# Patient Record
Sex: Male | Born: 1980 | Race: White | Hispanic: No | Marital: Married | Smoking: Former smoker
Health system: Southern US, Community
[De-identification: ages and names within clinical notes are randomized; demographics above are authoritative.]

## PROBLEM LIST (undated history)

## (undated) DIAGNOSIS — Z87442 Personal history of urinary calculi: Secondary | ICD-10-CM

## (undated) DIAGNOSIS — F32A Depression, unspecified: Secondary | ICD-10-CM

## (undated) DIAGNOSIS — J449 Chronic obstructive pulmonary disease, unspecified: Secondary | ICD-10-CM

## (undated) DIAGNOSIS — I10 Essential (primary) hypertension: Secondary | ICD-10-CM

## (undated) DIAGNOSIS — E079 Disorder of thyroid, unspecified: Secondary | ICD-10-CM

## (undated) DIAGNOSIS — E119 Type 2 diabetes mellitus without complications: Secondary | ICD-10-CM

## (undated) DIAGNOSIS — M549 Dorsalgia, unspecified: Secondary | ICD-10-CM

## (undated) DIAGNOSIS — F419 Anxiety disorder, unspecified: Secondary | ICD-10-CM

## (undated) DIAGNOSIS — G8929 Other chronic pain: Secondary | ICD-10-CM

## (undated) DIAGNOSIS — Y249XXA Unspecified firearm discharge, undetermined intent, initial encounter: Secondary | ICD-10-CM

## (undated) DIAGNOSIS — W3400XA Accidental discharge from unspecified firearms or gun, initial encounter: Secondary | ICD-10-CM

## (undated) HISTORY — PX: TONSILLECTOMY: SUR1361

## (undated) HISTORY — PX: CHOLECYSTECTOMY: SHX55

---

## 2007-08-08 ENCOUNTER — Emergency Department (HOSPITAL_COMMUNITY): Admission: EM | Admit: 2007-08-08 | Discharge: 2007-08-09 | Payer: Self-pay | Admitting: Emergency Medicine

## 2007-08-08 IMAGING — CR DG CHEST 1V PORT
1 series · 1 of 1 positions shown · non-contrast
Comparison: none

CLINICAL DATA: Chest pain.
 PORTABLE CHEST - 1 VIEW:

[view not recorded]
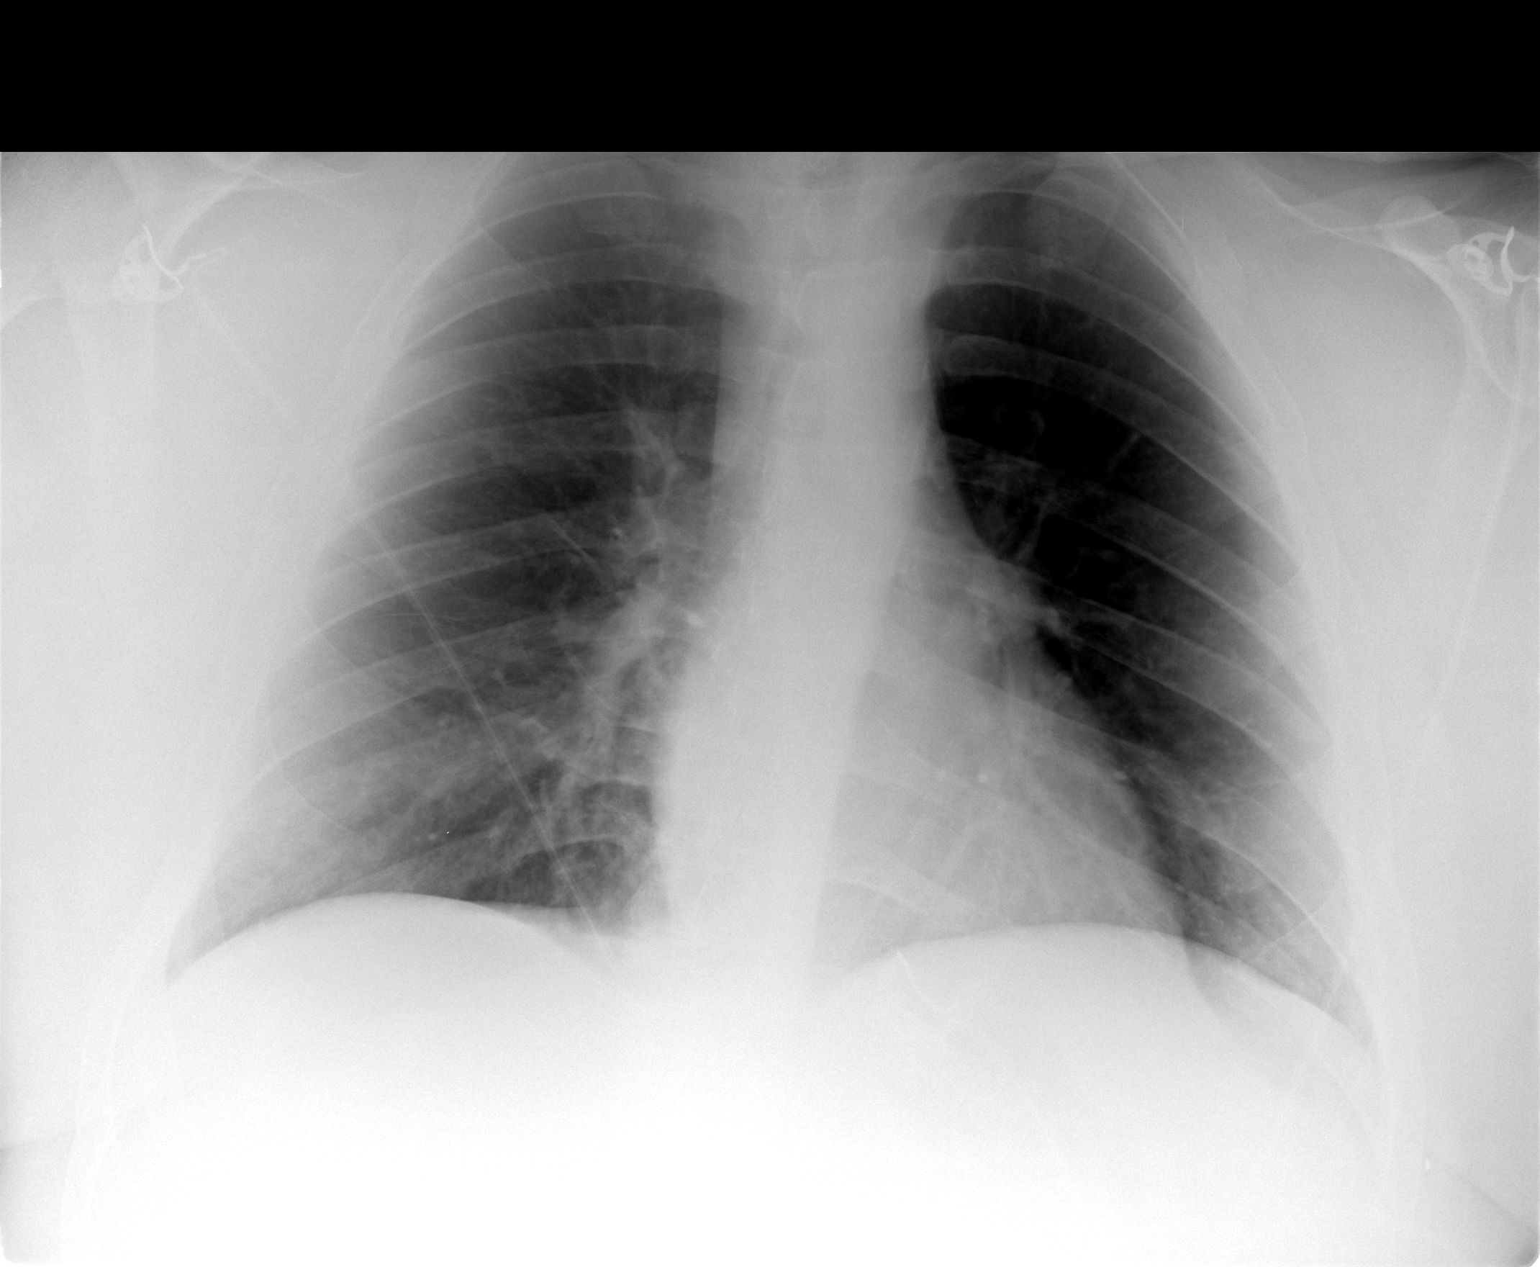

[1 of 1 positions shown; findings below may reference images not displayed]

FINDINGS: The heart size and mediastinal contours are within normal limits.   Both lungs are clear.
IMPRESSION: No acute findings.

## 2008-09-27 ENCOUNTER — Emergency Department (HOSPITAL_COMMUNITY): Admission: EM | Admit: 2008-09-27 | Discharge: 2008-09-27 | Payer: Self-pay | Admitting: Emergency Medicine

## 2009-04-10 ENCOUNTER — Inpatient Hospital Stay (HOSPITAL_COMMUNITY): Admission: EM | Admit: 2009-04-10 | Discharge: 2009-04-11 | Payer: Self-pay | Admitting: Emergency Medicine
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
## 2010-08-22 LAB — COMPREHENSIVE METABOLIC PANEL
AST: 31 U/L (ref 0–37)
Albumin: 3.9 g/dL (ref 3.5–5.2)
Alkaline Phosphatase: 43 U/L (ref 39–117)
Chloride: 106 mEq/L (ref 96–112)
Creatinine, Ser: 1.21 mg/dL (ref 0.4–1.5)
GFR calc Af Amer: >60 mL/min (ref >60)
Potassium: 3.7 mEq/L (ref 3.5–5.1)
Sodium: 137 mEq/L (ref 135–145)
Total Bilirubin: 0.6 mg/dL (ref 0.3–1.2)

## 2010-08-22 LAB — DIFFERENTIAL
Basophils Absolute: 0.1 10*3/uL (ref 0.0–0.1)
Eosinophils Relative: 3 % (ref 0–5)
Lymphocytes Relative: 22 % (ref 12–46)
Monocytes Absolute: 0.6 10*3/uL (ref 0.1–1.0)

## 2010-08-22 LAB — CBC
Platelets: 232 10*3/uL (ref 150–400)
WBC: 9.6 10*3/uL (ref 4.0–10.5)

## 2010-08-22 LAB — GLUCOSE, CAPILLARY: Glucose-Capillary: 174 mg/dL — ABNORMAL HIGH (ref 70–99)

## 2010-08-22 LAB — BLOOD GAS, ARTERIAL
Acid-base deficit: 2 mmol/L (ref 0.0–2.0)
O2 Saturation: 97.2 %
pO2, Arterial: 82.7 mmHg (ref 80.0–100.0)

## 2010-08-28 LAB — OVA AND PARASITE EXAMINATION: Ova and parasites: NONE SEEN

## 2010-08-28 LAB — CLOSTRIDIUM DIFFICILE EIA: C difficile Toxins A+B, EIA: NEGATIVE

## 2010-08-28 LAB — BASIC METABOLIC PANEL
Calcium: 9 mg/dL (ref 8.4–10.5)
GFR calc Af Amer: >60 mL/min (ref >60)
GFR calc non Af Amer: >60 mL/min (ref >60)
Sodium: 138 mEq/L (ref 135–145)

## 2011-02-11 LAB — BASIC METABOLIC PANEL
BUN: 14
CO2: 25
Calcium: 9.3
GFR calc non Af Amer: >60
Glucose, Bld: 106 — ABNORMAL HIGH

## 2011-02-11 LAB — CBC
HCT: 46.9
MCHC: 35
Platelets: 236
RDW: 12.9

## 2011-02-11 LAB — POCT CARDIAC MARKERS
CKMB, poc: 1.2
Myoglobin, poc: 47.1
Operator id: 198161
Troponin i, poc: <0.05

## 2011-02-11 LAB — DIFFERENTIAL
Basophils Absolute: 0
Basophils Relative: 0
Eosinophils Relative: 1
Lymphocytes Relative: 27
Neutro Abs: 6.6

## 2011-02-13 ENCOUNTER — Encounter: Payer: Self-pay | Admitting: Emergency Medicine

## 2011-02-13 ENCOUNTER — Emergency Department (HOSPITAL_COMMUNITY)
Admission: EM | Admit: 2011-02-13 | Discharge: 2011-02-13 | Disposition: A | Discharge status: Home / Self Care | Payer: BC Managed Care – PPO | Attending: Emergency Medicine | Admitting: Emergency Medicine

## 2011-02-13 ENCOUNTER — Emergency Department (HOSPITAL_COMMUNITY): Payer: BC Managed Care – PPO

## 2011-02-13 DIAGNOSIS — E079 Disorder of thyroid, unspecified: Secondary | ICD-10-CM | POA: Insufficient documentation

## 2011-02-13 DIAGNOSIS — Z87828 Personal history of other (healed) physical injury and trauma: Secondary | ICD-10-CM | POA: Insufficient documentation

## 2011-02-13 DIAGNOSIS — R1012 Left upper quadrant pain: Secondary | ICD-10-CM | POA: Insufficient documentation

## 2011-02-13 DIAGNOSIS — J45909 Unspecified asthma, uncomplicated: Secondary | ICD-10-CM | POA: Insufficient documentation

## 2011-02-13 DIAGNOSIS — R109 Unspecified abdominal pain: Secondary | ICD-10-CM

## 2011-02-13 DIAGNOSIS — Z87891 Personal history of nicotine dependence: Secondary | ICD-10-CM | POA: Insufficient documentation

## 2011-02-13 HISTORY — DX: Disorder of thyroid, unspecified: E07.9

## 2011-02-13 LAB — URINALYSIS, ROUTINE W REFLEX MICROSCOPIC
Bilirubin Urine: NEGATIVE
Hgb urine dipstick: NEGATIVE
Ketones, ur: NEGATIVE mg/dL
Nitrite: NEGATIVE
Specific Gravity, Urine: 1.01 (ref 1.005–1.030)
pH: 6.5 (ref 5.0–8.0)

## 2011-02-13 LAB — DIFFERENTIAL
Basophils Relative: 0 % (ref 0–1)
Eosinophils Absolute: 0.1 10*3/uL (ref 0.0–0.7)
Eosinophils Relative: 0 % (ref 0–5)
Lymphs Abs: 2.1 10*3/uL (ref 0.7–4.0)
Monocytes Absolute: 1.1 10*3/uL — ABNORMAL HIGH (ref 0.1–1.0)
Monocytes Relative: 8 % (ref 3–12)

## 2011-02-13 LAB — BASIC METABOLIC PANEL
BUN: 17 mg/dL (ref 6–23)
CO2: 30 mEq/L (ref 19–32)
Calcium: 8.6 mg/dL (ref 8.4–10.5)
Creatinine, Ser: 1.73 mg/dL — ABNORMAL HIGH (ref 0.50–1.35)
Creatinine, Ser: 1.75 mg/dL — ABNORMAL HIGH (ref 0.50–1.35)
GFR calc Af Amer: 57 mL/min — ABNORMAL LOW (ref >60)
GFR calc non Af Amer: 47 mL/min — ABNORMAL LOW (ref >60)
Glucose, Bld: 88 mg/dL (ref 70–99)
Glucose, Bld: 94 mg/dL (ref 70–99)
Potassium: 4.2 mEq/L (ref 3.5–5.1)

## 2011-02-13 LAB — CBC
HCT: 47.4 % (ref 39.0–52.0)
Hemoglobin: 16 g/dL (ref 13.0–17.0)
MCH: 32.1 pg (ref 26.0–34.0)
MCHC: 33.8 g/dL (ref 30.0–36.0)
MCV: 95 fL (ref 78.0–100.0)
RBC: 4.99 MIL/uL (ref 4.22–5.81)

## 2011-02-13 IMAGING — CT CT ABD-PELV W/ CM
2 of 4 series · 17 of 46 positions shown, 19 images · IV contrast (Omnipaque 300)
Comparison: None.

CLINICAL DATA: Epigastric/left upper quadrant abdominal pain

CT ABDOMEN AND PELVIS WITH CONTRAST
TECHNIQUE: Multidetector CT imaging of the abdomen and pelvis was
performed following the standard protocol during bolus
administration of intravenous contrast.
Contrast:  100 ml [DN] IV

[Series 2: abd_pel_with 5.0 b40s · axial · 0.98mm/px · z∈[-570,-124]mm · 14 of 99 slices shown, 16 images]
[im 5/99  soft-tissue]
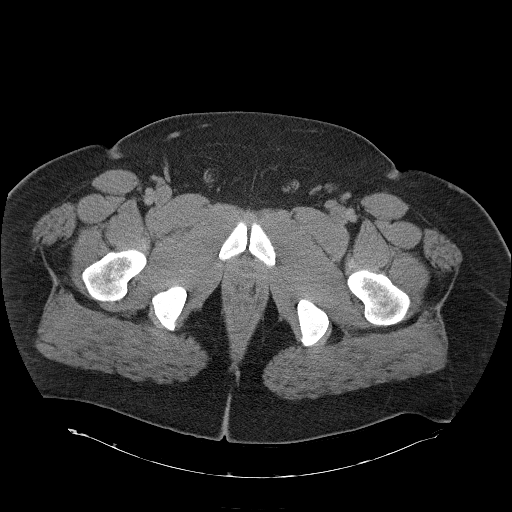
[im 5/99  bone]
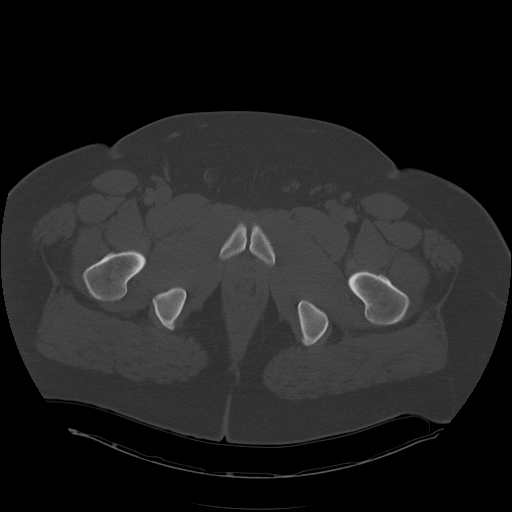
[im 13/99  soft-tissue]
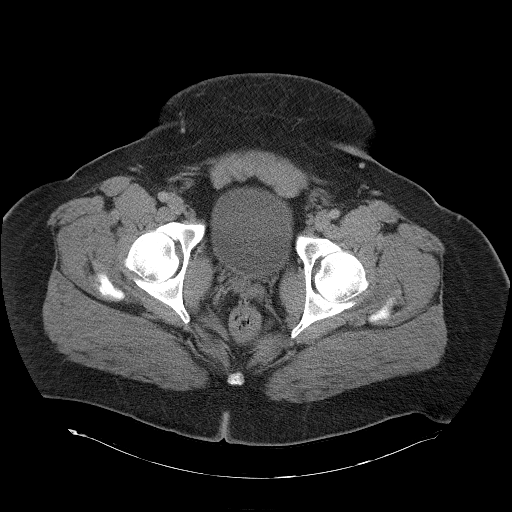
[im 21/99  soft-tissue]
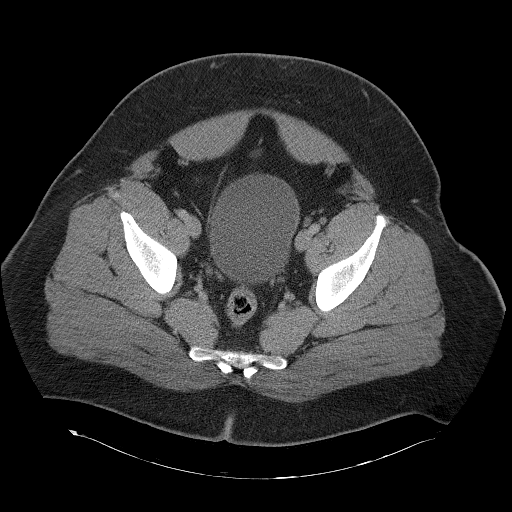
[im 25/99  soft-tissue]
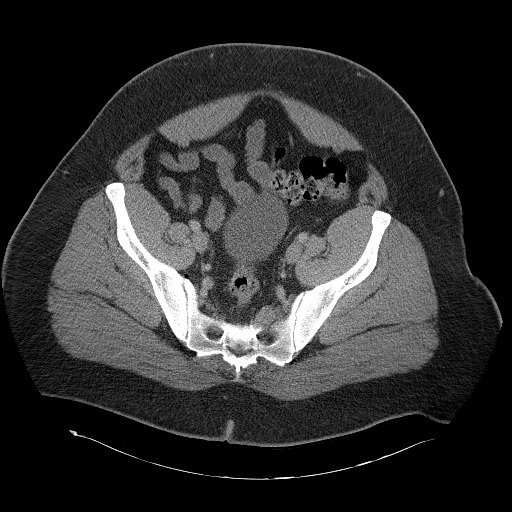
[im 33/99  soft-tissue]
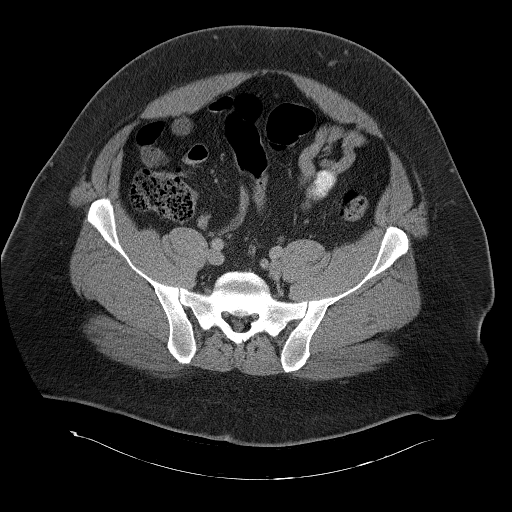
[im 41/99  soft-tissue]
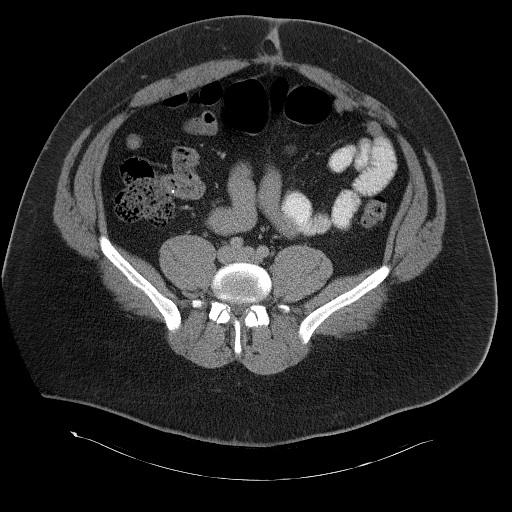
[im 45/99  soft-tissue]
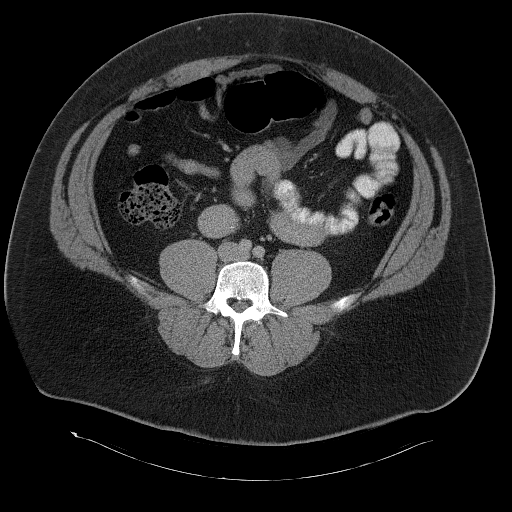
[im 54/99  soft-tissue]
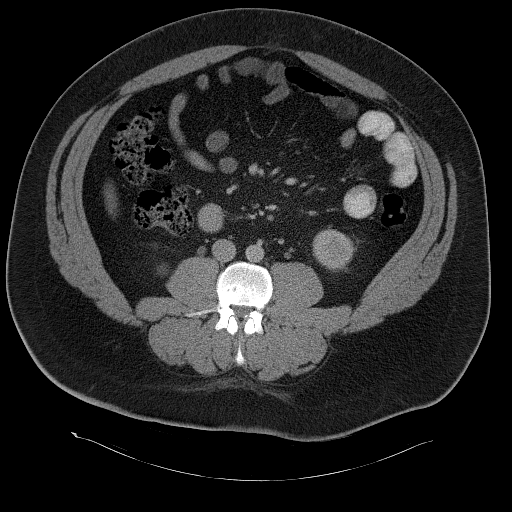
[im 58/99  soft-tissue]
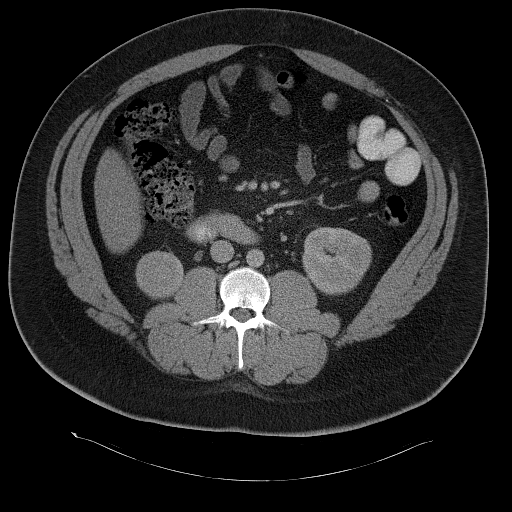
[im 58/99  bone]
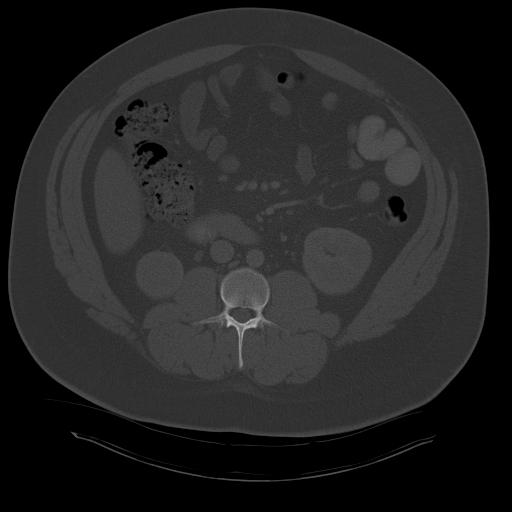
[im 66/99  soft-tissue]
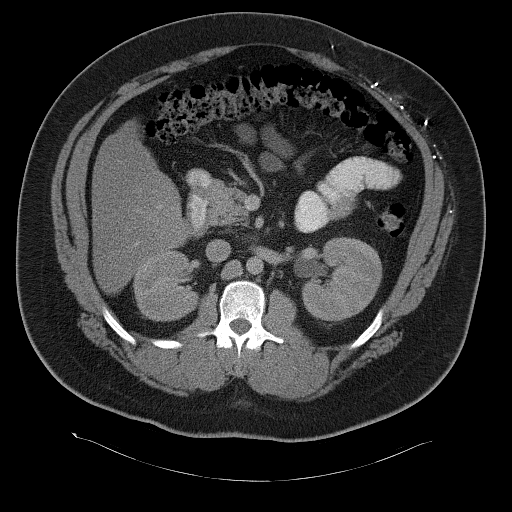
[im 74/99  soft-tissue]
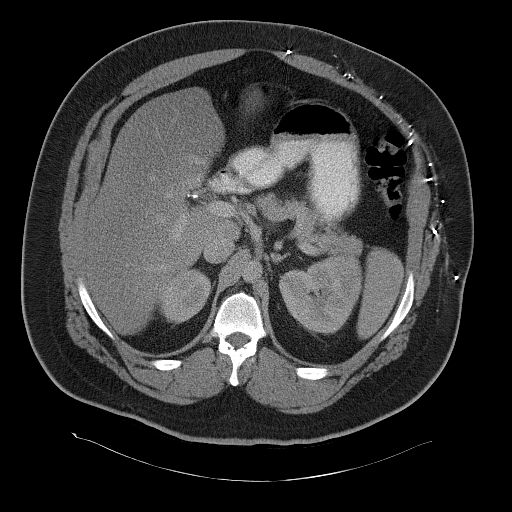
[im 78/99  soft-tissue]
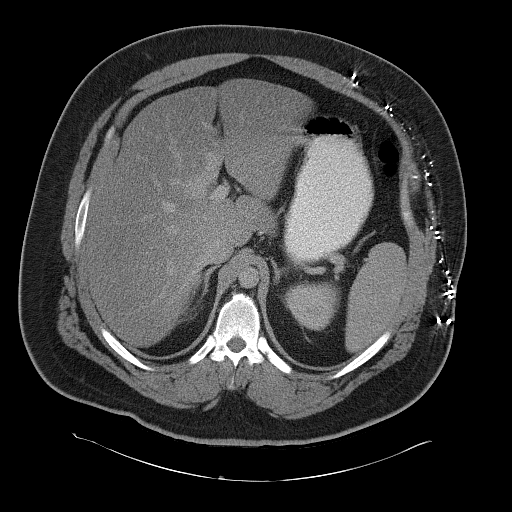
[im 86/99  soft-tissue]
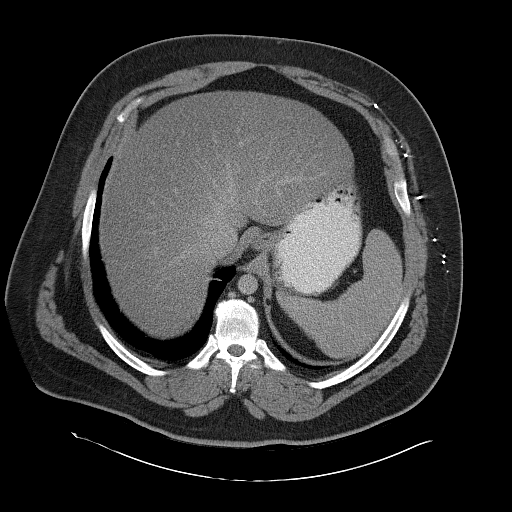
[im 94/99  soft-tissue]
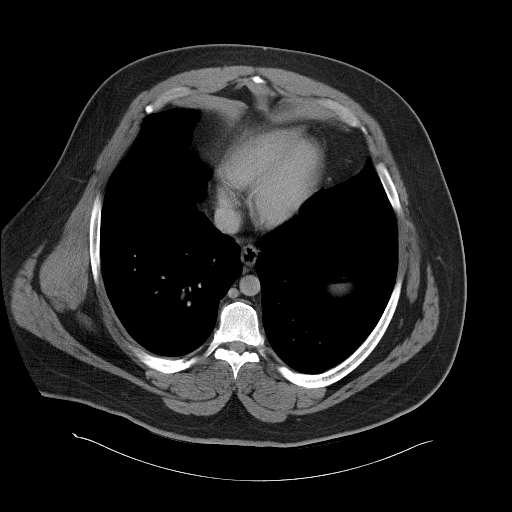

[Series 4: mpr cor post contrast (id) · coronal · 0.93mm/px · 3 of 130 slices shown]
[im 44/130  soft-tissue]
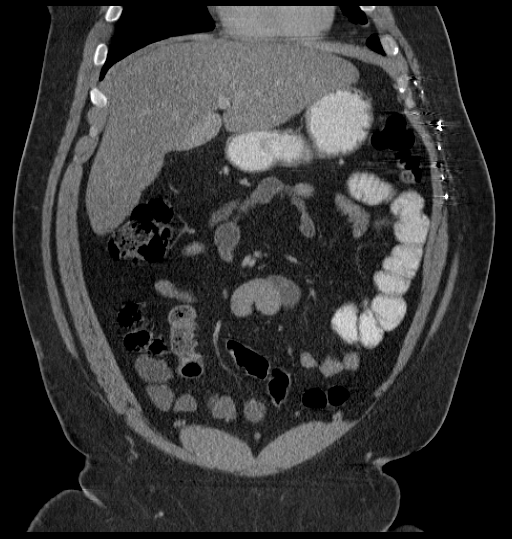
[im 58/130  soft-tissue]
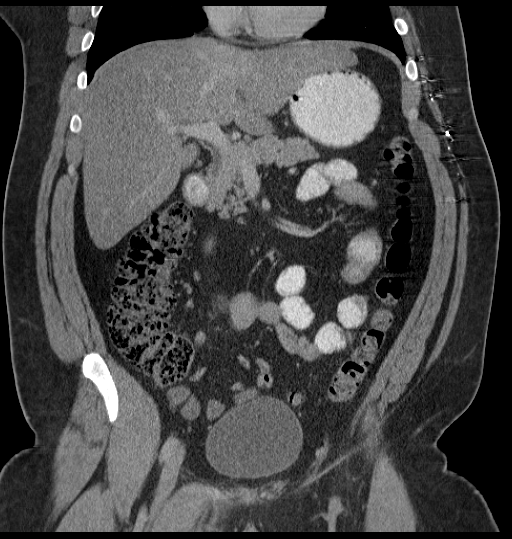
[im 72/130  soft-tissue]
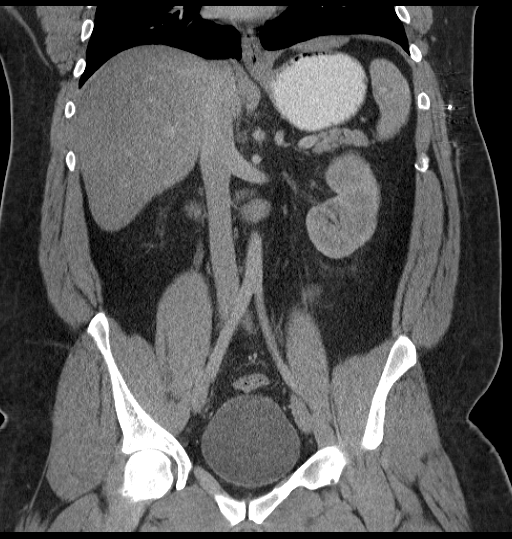

[17 of 46 positions shown; findings below may reference images not displayed]

FINDINGS: Lung bases are clear.

The liver is notable for hepatic steatosis.

Spleen, pancreas, and adrenal glands are within normal limits.

Status post cholecystectomy.  No intrahepatic or extrahepatic
ductal dilatation.

Kidneys are notable for a probable small left upper pole cyst.  No
renal calculi or hydronephrosis.

No evidence of bowel obstruction.  Normal appendix.  No colonic
wall thickening or inflammatory changes.

No evidence of abdominal aortic aneurysm.

No abdominopelvic ascites.

No suspicious abdominopelvic lymphadenopathy.

Prostate is unremarkable.

No ureteral or bladder calculi.  Bladder is within normal limits.

Tiny fat containing umbilical hernia (series 2/image 58).
Postsurgical changes in the left lateral abdominal wall.

Mild degenerative changes of the visualized thoracolumbar spine.
IMPRESSION: Hepatic steatosis.

No evidence of bowel obstruction.  Normal appendix.

Tiny umbilical hernia.

Postsurgical changes in the left lateral abdominal wall.

## 2011-02-13 MED ORDER — SODIUM CHLORIDE 0.9 % IV BOLUS (SEPSIS)
1000.0000 mL | Freq: Once | INTRAVENOUS | Status: AC
  Administered 2011-02-13: 1000 mL via INTRAVENOUS

## 2011-02-13 MED ORDER — MORPHINE SULFATE 4 MG/ML IJ SOLN
4.0000 mg | Freq: Once | INTRAMUSCULAR | Status: AC
  Administered 2011-02-13: 4 mg via INTRAVENOUS
  Filled 2011-02-13: qty 1

## 2011-02-13 MED ORDER — HYDROCODONE-ACETAMINOPHEN 5-500 MG PO TABS: 1.0000 | ORAL_TABLET | Freq: Four times a day (QID) | ORAL | Status: AC | PRN

## 2011-02-13 MED ORDER — IOHEXOL 300 MG/ML  SOLN: 100.0000 mL | Freq: Once | INTRAMUSCULAR | Status: DC | PRN

## 2011-02-13 MED ORDER — ONDANSETRON HCL 4 MG/2ML IJ SOLN
4.0000 mg | Freq: Once | INTRAMUSCULAR | Status: AC
  Administered 2011-02-13: 4 mg via INTRAVENOUS
  Filled 2011-02-13: qty 2

## 2011-02-13 NOTE — ED Notes (Signed)
Pt complaining of increased flank/abd pain. MD notified. New med order obtained and administered.

## 2011-02-13 NOTE — ED Notes (Signed)
Patient states he is still having severe pain. RN Jill Alexanders aware. Patient did not need anything else at this time.

## 2011-02-13 NOTE — ED Notes (Signed)
Patient is comfortable at this time states he does not need anything.
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
## 2011-02-13 NOTE — ED Notes (Signed)
Pt c/o left flank pain radiating into left abd. Worse food/drink/movement. Pt also c/o polyuria and "foamy urine". nad noted.

## 2011-02-13 NOTE — ED Notes (Signed)
Finished drinking his contrast.

## 2012-03-23 ENCOUNTER — Other Ambulatory Visit: Payer: Self-pay

## 2012-03-23 ENCOUNTER — Observation Stay (HOSPITAL_COMMUNITY)
Admission: EM | Admit: 2012-03-23 | Discharge: 2012-03-24 | Disposition: A | Discharge status: Home / Self Care | Payer: BC Managed Care – PPO | Attending: Internal Medicine | Admitting: Internal Medicine

## 2012-03-23 ENCOUNTER — Encounter (HOSPITAL_COMMUNITY): Payer: Self-pay | Admitting: Emergency Medicine

## 2012-03-23 ENCOUNTER — Emergency Department (HOSPITAL_COMMUNITY): Payer: BC Managed Care – PPO

## 2012-03-23 DIAGNOSIS — J45902 Unspecified asthma with status asthmaticus: Principal | ICD-10-CM | POA: Insufficient documentation

## 2012-03-23 DIAGNOSIS — R0602 Shortness of breath: Secondary | ICD-10-CM | POA: Insufficient documentation

## 2012-03-23 DIAGNOSIS — Z23 Encounter for immunization: Secondary | ICD-10-CM | POA: Insufficient documentation

## 2012-03-23 DIAGNOSIS — J45901 Unspecified asthma with (acute) exacerbation: Secondary | ICD-10-CM

## 2012-03-23 DIAGNOSIS — R0683 Snoring: Secondary | ICD-10-CM | POA: Diagnosis present

## 2012-03-23 DIAGNOSIS — M549 Dorsalgia, unspecified: Secondary | ICD-10-CM | POA: Diagnosis present

## 2012-03-23 DIAGNOSIS — R51 Headache: Secondary | ICD-10-CM | POA: Insufficient documentation

## 2012-03-23 DIAGNOSIS — G4733 Obstructive sleep apnea (adult) (pediatric): Secondary | ICD-10-CM | POA: Insufficient documentation

## 2012-03-23 HISTORY — DX: Dorsalgia, unspecified: M54.9

## 2012-03-23 HISTORY — DX: Other chronic pain: G89.29
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
## 2012-03-23 MED ORDER — BISACODYL 5 MG PO TBEC: 5.0000 mg | DELAYED_RELEASE_TABLET | Freq: Every day | ORAL | Status: DC | PRN

## 2012-03-23 MED ORDER — CYCLOBENZAPRINE HCL 10 MG PO TABS: 10.0000 mg | ORAL_TABLET | Freq: Three times a day (TID) | ORAL | Status: DC | PRN

## 2012-03-23 MED ORDER — ALBUTEROL SULFATE (5 MG/ML) 0.5% IN NEBU
5.0000 mg | INHALATION_SOLUTION | Freq: Once | RESPIRATORY_TRACT | Status: AC
  Administered 2012-03-23: 5 mg via RESPIRATORY_TRACT
  Filled 2012-03-23: qty 1

## 2012-03-23 MED ORDER — HYDROMORPHONE HCL PF 1 MG/ML IJ SOLN: 0.5000 mg | INTRAMUSCULAR | Status: DC | PRN

## 2012-03-23 MED ORDER — ALBUTEROL SULFATE (5 MG/ML) 0.5% IN NEBU
INHALATION_SOLUTION | RESPIRATORY_TRACT | Status: AC
  Filled 2012-03-23: qty 2

## 2012-03-23 MED ORDER — ALBUTEROL (5 MG/ML) CONTINUOUS INHALATION SOLN
10.0000 mg/h | INHALATION_SOLUTION | RESPIRATORY_TRACT | Status: DC
  Administered 2012-03-23: 10 mg/h via RESPIRATORY_TRACT

## 2012-03-23 MED ORDER — TRAZODONE HCL 50 MG PO TABS
25.0000 mg | ORAL_TABLET | Freq: Every evening | ORAL | Status: DC | PRN
  Administered 2012-03-24: 25 mg via ORAL
  Filled 2012-03-23: qty 2

## 2012-03-23 MED ORDER — GUAIFENESIN ER 600 MG PO TB12
1200.0000 mg | ORAL_TABLET | Freq: Two times a day (BID) | ORAL | Status: DC
  Administered 2012-03-24 (×2): 1200 mg via ORAL
  Filled 2012-03-23 (×2): qty 2

## 2012-03-23 MED ORDER — METHYLPREDNISOLONE SODIUM SUCC 125 MG IJ SOLR
125.0000 mg | Freq: Four times a day (QID) | INTRAMUSCULAR | Status: DC
  Administered 2012-03-24 (×3): 125 mg via INTRAVENOUS
  Filled 2012-03-23 (×3): qty 2

## 2012-03-23 MED ORDER — SODIUM CHLORIDE 0.9 % IV SOLN
INTRAVENOUS | Status: DC
  Administered 2012-03-24: via INTRAVENOUS

## 2012-03-23 MED ORDER — ACETAMINOPHEN 325 MG PO TABS: 650.0000 mg | ORAL_TABLET | ORAL | Status: DC | PRN

## 2012-03-23 MED ORDER — PNEUMOCOCCAL VAC POLYVALENT 25 MCG/0.5ML IJ INJ
0.5000 mL | INJECTION | INTRAMUSCULAR | Status: AC
  Administered 2012-03-24: 0.5 mL via INTRAMUSCULAR
  Filled 2012-03-23: qty 0.5

## 2012-03-23 MED ORDER — ENOXAPARIN SODIUM 40 MG/0.4ML ~~LOC~~ SOLN
40.0000 mg | SUBCUTANEOUS | Status: DC
  Administered 2012-03-24: 40 mg via SUBCUTANEOUS
  Filled 2012-03-23: qty 0.4

## 2012-03-23 MED ORDER — ALBUTEROL SULFATE (5 MG/ML) 0.5% IN NEBU: 2.5000 mg | INHALATION_SOLUTION | RESPIRATORY_TRACT | Status: DC | PRN

## 2012-03-23 MED ORDER — IPRATROPIUM BROMIDE 0.02 % IN SOLN
0.5000 mg | Freq: Once | RESPIRATORY_TRACT | Status: AC
  Administered 2012-03-23: 0.5 mg via RESPIRATORY_TRACT
  Filled 2012-03-23: qty 2.5

## 2012-03-23 MED ORDER — ALBUTEROL SULFATE (5 MG/ML) 0.5% IN NEBU
2.5000 mg | INHALATION_SOLUTION | RESPIRATORY_TRACT | Status: DC
  Administered 2012-03-24 (×4): 2.5 mg via RESPIRATORY_TRACT
  Filled 2012-03-23 (×4): qty 0.5

## 2012-03-23 MED ORDER — ALBUTEROL SULFATE HFA 108 (90 BASE) MCG/ACT IN AERS
2.0000 | INHALATION_SPRAY | Freq: Four times a day (QID) | RESPIRATORY_TRACT | Status: DC
  Administered 2012-03-23: 2 via RESPIRATORY_TRACT
  Filled 2012-03-23: qty 6.7

## 2012-03-23 MED ORDER — ONDANSETRON HCL 4 MG/2ML IJ SOLN: 4.0000 mg | INTRAMUSCULAR | Status: DC | PRN

## 2012-03-23 MED ORDER — INFLUENZA VIRUS VACC SPLIT PF IM SUSP
0.5000 mL | INTRAMUSCULAR | Status: AC
  Administered 2012-03-24: 0.5 mL via INTRAMUSCULAR
  Filled 2012-03-23: qty 0.5

## 2012-03-23 MED ORDER — POLYETHYLENE GLYCOL 3350 17 G PO PACK: 17.0000 g | PACK | Freq: Every day | ORAL | Status: DC | PRN

## 2012-03-23 MED ORDER — PREDNISONE 50 MG PO TABS
80.0000 mg | ORAL_TABLET | Freq: Once | ORAL | Status: AC
  Administered 2012-03-23: 80 mg via ORAL
  Filled 2012-03-23: qty 1

## 2012-03-23 MED ORDER — BUDESONIDE 0.25 MG/2ML IN SUSP
0.2500 mg | Freq: Two times a day (BID) | RESPIRATORY_TRACT | Status: DC
  Administered 2012-03-24: 0.25 mg via RESPIRATORY_TRACT
  Filled 2012-03-23 (×4): qty 2

## 2012-03-23 MED ORDER — FAMOTIDINE 20 MG PO TABS
20.0000 mg | ORAL_TABLET | Freq: Two times a day (BID) | ORAL | Status: DC
  Administered 2012-03-24 (×2): 20 mg via ORAL
  Filled 2012-03-23 (×2): qty 1

## 2012-03-23 MED ORDER — DOCUSATE SODIUM 100 MG PO CAPS
100.0000 mg | ORAL_CAPSULE | Freq: Two times a day (BID) | ORAL | Status: DC
  Administered 2012-03-24: 100 mg via ORAL
  Filled 2012-03-23: qty 1

## 2012-03-23 MED ORDER — FLEET ENEMA 7-19 GM/118ML RE ENEM: 1.0000 | ENEMA | Freq: Once | RECTAL | Status: AC | PRN

## 2012-03-23 MED ORDER — PREDNISONE 10 MG PO TABS: 40.0000 mg | ORAL_TABLET | Freq: Every day | ORAL | Status: DC

## 2012-03-23 MED ORDER — AZITHROMYCIN 500 MG IV SOLR
500.0000 mg | INTRAVENOUS | Status: DC
  Administered 2012-03-24: 500 mg via INTRAVENOUS
  Filled 2012-03-23 (×2): qty 500

## 2012-03-23 MED ORDER — OXYCODONE HCL 5 MG PO TABS: 10.0000 mg | ORAL_TABLET | ORAL | Status: DC | PRN

## 2012-03-23 NOTE — ED Notes (Signed)
Per RT, pt needs continuous neb. edp aware and vo given.
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
## 2012-03-23 NOTE — ED Notes (Signed)
Pt finished cont neb. Wheezing still present. Pt states he feels a little better.

## 2012-03-23 NOTE — ED Notes (Signed)
Pt ambulated with pulse ox. O2 sats dropped to 92% on room air with audible  Wheezing. MD aware will speak with patient

## 2012-03-23 NOTE — ED Notes (Signed)
Pt c/o sob x 2 days worse with exertion. Audible wheezing.

## 2012-03-24 ENCOUNTER — Encounter (HOSPITAL_COMMUNITY): Payer: Self-pay | Admitting: Internal Medicine

## 2012-03-24 DIAGNOSIS — J45909 Unspecified asthma, uncomplicated: Secondary | ICD-10-CM

## 2012-03-24 LAB — CBC
HCT: 46.5 % (ref 39.0–52.0)
Hemoglobin: 15.9 g/dL (ref 13.0–17.0)
MCH: 31.6 pg (ref 26.0–34.0)
MCHC: 34.2 g/dL (ref 30.0–36.0)
MCV: 91 fL (ref 78.0–100.0)
Platelets: 205 10*3/uL (ref 150–400)
RBC: 5.1 MIL/uL (ref 4.22–5.81)
RBC: 5.12 MIL/uL (ref 4.22–5.81)
WBC: 14.5 10*3/uL — ABNORMAL HIGH (ref 4.0–10.5)

## 2012-03-24 LAB — COMPREHENSIVE METABOLIC PANEL
ALT: 48 U/L (ref 0–53)
Alkaline Phosphatase: 46 U/L (ref 39–117)
BUN: 13 mg/dL (ref 6–23)
CO2: 23 mEq/L (ref 19–32)
Calcium: 9.6 mg/dL (ref 8.4–10.5)
GFR calc Af Amer: >90 mL/min (ref >90)
GFR calc non Af Amer: >90 mL/min (ref >90)
Glucose, Bld: 197 mg/dL — ABNORMAL HIGH (ref 70–99)
Sodium: 139 mEq/L (ref 135–145)
Total Protein: 7.4 g/dL (ref 6.0–8.3)

## 2012-03-24 LAB — TSH: TSH: 1.144 u[IU]/mL (ref 0.350–4.500)

## 2012-03-24 MED ORDER — GUAIFENESIN ER 600 MG PO TB12: 1200.0000 mg | ORAL_TABLET | Freq: Two times a day (BID) | ORAL | Status: DC

## 2012-03-24 MED ORDER — DEXTROSE 5 % IV SOLN
INTRAVENOUS | Status: AC
  Filled 2012-03-24: qty 500

## 2012-03-24 MED ORDER — SODIUM CHLORIDE 0.9 % IJ SOLN
INTRAMUSCULAR | Status: AC
  Administered 2012-03-24: 3 mL
  Filled 2012-03-24: qty 3

## 2012-03-24 MED ORDER — PREDNISONE 10 MG PO TABS: ORAL_TABLET | ORAL | Status: DC

## 2012-03-24 MED ORDER — ALBUTEROL SULFATE (5 MG/ML) 0.5% IN NEBU: 2.5000 mg | INHALATION_SOLUTION | RESPIRATORY_TRACT | Status: DC

## 2012-03-24 MED ORDER — ENOXAPARIN SODIUM 100 MG/ML ~~LOC~~ SOLN: 90.0000 mg | SUBCUTANEOUS | Status: DC

## 2012-03-24 MED ORDER — AZITHROMYCIN 500 MG PO TABS: 500.0000 mg | ORAL_TABLET | Freq: Every day | ORAL | Status: DC

## 2012-03-24 NOTE — Progress Notes (Signed)
UR Chart Review Completed  
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
## 2012-03-27 NOTE — Progress Notes (Signed)
Pt verbalizes understanding of d/c instructions, medications and f/u appts. IV d/c. No questions at this time. Sheryn Bison

## 2013-03-03 ENCOUNTER — Other Ambulatory Visit: Payer: Self-pay | Admitting: General Practice

## 2013-03-25 ENCOUNTER — Ambulatory Visit: Payer: Self-pay | Admitting: Bariatrics

## 2013-04-19 ENCOUNTER — Ambulatory Visit: Payer: Self-pay | Admitting: Bariatrics

## 2013-05-19 DIAGNOSIS — J45909 Unspecified asthma, uncomplicated: Secondary | ICD-10-CM | POA: Insufficient documentation

## 2013-05-19 DIAGNOSIS — E039 Hypothyroidism, unspecified: Secondary | ICD-10-CM | POA: Insufficient documentation

## 2013-05-19 DIAGNOSIS — J449 Chronic obstructive pulmonary disease, unspecified: Secondary | ICD-10-CM | POA: Insufficient documentation

## 2013-07-01 ENCOUNTER — Ambulatory Visit: Payer: Self-pay | Admitting: Bariatrics

## 2013-07-18 ENCOUNTER — Ambulatory Visit: Payer: Self-pay | Admitting: Bariatrics

## 2014-05-20 HISTORY — PX: OTHER SURGICAL HISTORY: SHX169

## 2016-07-03 ENCOUNTER — Other Ambulatory Visit: Payer: Self-pay | Admitting: Family Medicine

## 2016-07-03 DIAGNOSIS — N63 Unspecified lump in unspecified breast: Secondary | ICD-10-CM

## 2016-07-11 ENCOUNTER — Other Ambulatory Visit: Payer: BC Managed Care – PPO

## 2016-07-11 ENCOUNTER — Ambulatory Visit: Payer: BC Managed Care – PPO

## 2018-03-30 ENCOUNTER — Encounter: Payer: Self-pay | Admitting: Emergency Medicine

## 2018-03-30 ENCOUNTER — Other Ambulatory Visit: Payer: Self-pay

## 2018-03-30 DIAGNOSIS — F17210 Nicotine dependence, cigarettes, uncomplicated: Secondary | ICD-10-CM | POA: Diagnosis not present

## 2018-03-30 DIAGNOSIS — N23 Unspecified renal colic: Secondary | ICD-10-CM | POA: Diagnosis not present

## 2018-03-30 DIAGNOSIS — J449 Chronic obstructive pulmonary disease, unspecified: Secondary | ICD-10-CM | POA: Diagnosis not present

## 2018-03-30 DIAGNOSIS — N132 Hydronephrosis with renal and ureteral calculous obstruction: Secondary | ICD-10-CM | POA: Diagnosis not present

## 2018-03-30 DIAGNOSIS — R1012 Left upper quadrant pain: Secondary | ICD-10-CM | POA: Diagnosis present

## 2018-03-30 LAB — COMPREHENSIVE METABOLIC PANEL
ALBUMIN: 4.8 g/dL (ref 3.5–5.0)
ALK PHOS: 45 U/L (ref 38–126)
ALT: 27 U/L (ref 0–44)
AST: 30 U/L (ref 15–41)
Anion gap: 8 (ref 5–15)
BUN: 15 mg/dL (ref 6–20)
CALCIUM: 9.7 mg/dL (ref 8.9–10.3)
CO2: 26 mmol/L (ref 22–32)
Chloride: 105 mmol/L (ref 98–111)
Creatinine, Ser: 1.17 mg/dL (ref 0.61–1.24)
GFR calc Af Amer: >60 mL/min (ref >60)
GFR calc non Af Amer: >60 mL/min (ref >60)
Glucose, Bld: 134 mg/dL — ABNORMAL HIGH (ref 70–99)
Potassium: 3.9 mmol/L (ref 3.5–5.1)
SODIUM: 139 mmol/L (ref 135–145)
Total Bilirubin: 0.8 mg/dL (ref 0.3–1.2)
Total Protein: 8 g/dL (ref 6.5–8.1)

## 2018-03-30 LAB — URINALYSIS, COMPLETE (UACMP) WITH MICROSCOPIC
BACTERIA UA: NONE SEEN
BILIRUBIN URINE: NEGATIVE
Glucose, UA: NEGATIVE mg/dL
Ketones, ur: NEGATIVE mg/dL
Leukocytes, UA: NEGATIVE
Nitrite: NEGATIVE
Protein, ur: NEGATIVE mg/dL
RBC / HPF: >50 RBC/hpf — ABNORMAL HIGH (ref 0–5)
SPECIFIC GRAVITY, URINE: 1.016 (ref 1.005–1.030)
SQUAMOUS EPITHELIAL / LPF: NONE SEEN (ref 0–5)
pH: 5 (ref 5.0–8.0)

## 2018-03-30 LAB — CBC
HCT: 51.7 % (ref 39.0–52.0)
HEMOGLOBIN: 17.6 g/dL — AB (ref 13.0–17.0)
MCH: 31.2 pg (ref 26.0–34.0)
MCHC: 34 g/dL (ref 30.0–36.0)
MCV: 91.5 fL (ref 80.0–100.0)
Platelets: 267 10*3/uL (ref 150–400)
RBC: 5.65 MIL/uL (ref 4.22–5.81)
RDW: 12.2 % (ref 11.5–15.5)
WBC: 12.2 10*3/uL — ABNORMAL HIGH (ref 4.0–10.5)
nRBC: 0 % (ref 0.0–0.2)

## 2018-03-30 LAB — LIPASE, BLOOD: Lipase: 37 U/L (ref 11–51)

## 2018-03-30 NOTE — ED Triage Notes (Signed)
Pt in with left sided rib pain radiates to left abd started at 1600. Worse when he moves, denies any vomiting or diarrhea but does have nausea.

## 2018-03-31 ENCOUNTER — Emergency Department
Admission: EM | Admit: 2018-03-31 | Discharge: 2018-03-31 | Disposition: A | Discharge status: Home / Self Care | Payer: BC Managed Care – PPO | Attending: Emergency Medicine | Admitting: Emergency Medicine

## 2018-03-31 ENCOUNTER — Emergency Department: Payer: BC Managed Care – PPO

## 2018-03-31 ENCOUNTER — Encounter: Payer: Self-pay | Admitting: Emergency Medicine

## 2018-03-31 DIAGNOSIS — N23 Unspecified renal colic: Secondary | ICD-10-CM

## 2018-03-31 DIAGNOSIS — N201 Calculus of ureter: Secondary | ICD-10-CM

## 2018-03-31 HISTORY — DX: Chronic obstructive pulmonary disease, unspecified: J44.9

## 2018-03-31 HISTORY — DX: Unspecified firearm discharge, undetermined intent, initial encounter: Y24.9XXA

## 2018-03-31 HISTORY — DX: Personal history of urinary calculi: Z87.442

## 2018-03-31 HISTORY — DX: Accidental discharge from unspecified firearms or gun, initial encounter: W34.00XA
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
## 2018-03-31 MED ORDER — ONDANSETRON 4 MG PO TBDP: ORAL_TABLET | ORAL | 0 refills | Status: DC

## 2018-03-31 MED ORDER — TAMSULOSIN HCL 0.4 MG PO CAPS: ORAL_CAPSULE | ORAL | 0 refills | Status: DC

## 2018-03-31 MED ORDER — KETOROLAC TROMETHAMINE 30 MG/ML IJ SOLN
15.0000 mg | Freq: Once | INTRAMUSCULAR | Status: AC
  Administered 2018-03-31: 15 mg via INTRAVENOUS

## 2018-03-31 MED ORDER — OXYCODONE-ACETAMINOPHEN 5-325 MG PO TABS: 1.0000 | ORAL_TABLET | Freq: Four times a day (QID) | ORAL | 0 refills | Status: DC | PRN

## 2018-03-31 MED ORDER — ONDANSETRON HCL 4 MG/2ML IJ SOLN
INTRAMUSCULAR | Status: AC
  Filled 2018-03-31: qty 2

## 2018-03-31 MED ORDER — DOCUSATE SODIUM 100 MG PO CAPS: ORAL_CAPSULE | ORAL | 0 refills | Status: DC

## 2018-03-31 MED ORDER — OXYCODONE-ACETAMINOPHEN 5-325 MG PO TABS
2.0000 | ORAL_TABLET | Freq: Once | ORAL | Status: AC
  Administered 2018-03-31: 2 via ORAL
  Filled 2018-03-31: qty 2

## 2018-03-31 MED ORDER — KETOROLAC TROMETHAMINE 30 MG/ML IJ SOLN
INTRAMUSCULAR | Status: AC
  Filled 2018-03-31: qty 1

## 2018-03-31 MED ORDER — MORPHINE SULFATE (PF) 4 MG/ML IV SOLN
8.0000 mg | Freq: Once | INTRAVENOUS | Status: AC
  Administered 2018-03-31: 8 mg via INTRAVENOUS

## 2018-03-31 MED ORDER — ONDANSETRON HCL 4 MG/2ML IJ SOLN
4.0000 mg | INTRAMUSCULAR | Status: AC
  Administered 2018-03-31: 4 mg via INTRAVENOUS

## 2018-03-31 MED ORDER — MORPHINE SULFATE (PF) 4 MG/ML IV SOLN
INTRAVENOUS | Status: AC
  Filled 2018-03-31: qty 1

## 2018-03-31 NOTE — ED Notes (Signed)
Dr. York Ceriseforbach at bedside. Pt has left sided flank pain that started today.

## 2018-03-31 NOTE — Discharge Instructions (Signed)
You have been seen in the Emergency Department (ED) today for pain that we believe based on your workup, is caused by kidney stones.  As we have discussed, please drink plenty of fluids.  Please make a follow up appointment with the physician(s) listed elsewhere in this documentation.  You may take pain medication as needed but ONLY as prescribed.  Please also take your prescribed Flomax daily.  Please do not take ibuprofen, naproxen, aspirin, or any other NSAID until you follow up with urology; taking any of that kind of medication will prevent you from having lithotripsy on Thursday.  Call the urologist at the number provided and let them know that you are in the emergency department and that you have a 3 mm proximal obstructive ureteral stone on the left side, and that you would like to talk to Dr. Apolinar JunesBrandon about may be having lithotripsy on Thursday.  They will help you set up an appointment and give you additional instructions.  Do not drink alcohol, drive or participate in any other potentially dangerous activities while taking opiate pain medication as it may make you sleepy. Do not take this medication with any other sedating medications, either prescription or over-the-counter. If you were prescribed Percocet or Vicodin, do not take these with acetaminophen (Tylenol) as it is already contained within these medications.   Take Percocet as needed for severe pain.  This medication is an opiate (or narcotic) pain medication and can be habit forming.  Use it as little as possible to achieve adequate pain control.  Do not use or use it with extreme caution if you have a history of opiate abuse or dependence.  If you are on a pain contract with your primary care doctor or a pain specialist, be sure to let them know you were prescribed this medication today from the [facility].  This medication is intended for your use only - do not give any to anyone else and keep it in a secure  place where nobody else, especially children, have access to it.  It will also cause or worsen constipation, so you may want to consider taking an over-the-counter stool softener while you are taking this medication.  Return to the Emergency Department (ED) or call your doctor if you have any worsening pain, fever, painful urination, are unable to urinate, or develop other symptoms that concern you.
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
## 2018-04-02 ENCOUNTER — Ambulatory Visit (INDEPENDENT_AMBULATORY_CARE_PROVIDER_SITE_OTHER): Payer: BC Managed Care – PPO | Admitting: Urology

## 2018-04-02 ENCOUNTER — Other Ambulatory Visit: Payer: Self-pay

## 2018-04-02 ENCOUNTER — Encounter: Payer: Self-pay | Admitting: Urology

## 2018-04-02 VITALS — BP 139/90 | HR 105 | Wt 328.0 lb

## 2018-04-02 DIAGNOSIS — N201 Calculus of ureter: Secondary | ICD-10-CM
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
## 2018-05-05 ENCOUNTER — Ambulatory Visit: Payer: BC Managed Care – PPO | Admitting: Urology

## 2018-06-03 ENCOUNTER — Ambulatory Visit (INDEPENDENT_AMBULATORY_CARE_PROVIDER_SITE_OTHER): Payer: BC Managed Care – PPO | Admitting: Urology

## 2018-06-03 ENCOUNTER — Encounter: Payer: Self-pay | Admitting: Urology

## 2018-06-03 ENCOUNTER — Other Ambulatory Visit: Payer: Self-pay

## 2018-06-03 VITALS — BP 141/84 | HR 92 | Ht 73.0 in | Wt 336.0 lb

## 2018-06-03 DIAGNOSIS — K219 Gastro-esophageal reflux disease without esophagitis: Secondary | ICD-10-CM | POA: Insufficient documentation

## 2018-06-03 DIAGNOSIS — N200 Calculus of kidney: Secondary | ICD-10-CM

## 2018-06-03 NOTE — Progress Notes (Signed)
   06/03/2018 8:33 AM   Geanie Kenning 06/14/1980 [id number]  Reason for visit: Follow up nephrolithiasis  HPI: I saw Mr. Willie Strong back in urology clinic today in follow-up for nephrolithiasis.  I previously saw him in November 2019 when he was having acute left flank pain from a 3 mm proximal ureteral stone.  He reports his pain has since resolved and he passed the stone, however he since lost it was unable to bring it in for analysis today.  He has 5-6 previously passed stones spontaneously, and has never required intervention.  He has a strong family history of stone disease.  He had an ultrasound done in follow-up at an outside clinic to confirm resolution of his hydronephrosis, however these results are unavailable to me at this time.  He reports that the hydronephrosis had resolved.  There are no aggravating or alleviating factors.  Severity is mild.   ROS: Please see flowsheet from today's date for complete review of systems.  Physical Exam: BP (!) 141/84   Pulse 92   Ht 6\' 1"  (1.854 m)   Wt (!) 336 lb (152.4 kg)   BMI 44.33 kg/m    Constitutional:  Alert and oriented, No acute distress.  Obese Respiratory: Normal respiratory effort, no increased work of breathing. GI: Abdomen is soft, nontender, nondistended, no abdominal masses GU: No CVA tenderness Skin: No rashes, bruises or suspicious lesions. Neurologic: Grossly intact, no focal deficits, moving all 4 extremities. Psychiatric: Normal mood and affect  Laboratory Data: Calcium 9.7  Pertinent Imaging: We will obtain outside ultrasound  Assessment & Plan:   In summary, the patient is a 38 year old male with obesity and recurrent stone disease.  I offered him metabolic evaluation today with 24-hour urine collection, and he deferred at this time secondary to his hectic work schedule.  I did recommend obtaining a PTH and calcium today to rule out hyperparathyroidism in the setting of his borderline elevated calcium  previously.  We discussed general stone prevention strategies including adequate hydration with goal of producing 2.5 L of urine daily, increasing citric acid intake, increasing calcium intake during high oxalate meals, minimizing animal protein, and decreasing salt intake. Information about dietary recommendations given today.   Serum PTH and calcium, call with results Follow-up outside ultrasound to confirm resolution of hydronephrosis RTC 1 year with KUB, discussed stone prevention, rediscussed metabolic therapy  Sondra Come, MD  [facility] [address], [address] [place], [place] [address] [phone]

## 2018-06-04 ENCOUNTER — Telehealth: Payer: Self-pay | Admitting: Family Medicine

## 2018-06-04 LAB — PTH, INTACT AND CALCIUM
CALCIUM: 9.5 mg/dL (ref 8.7–10.2)
PTH: 59 pg/mL (ref 15–65)

## 2018-06-04 NOTE — Telephone Encounter (Signed)
Patient notified and voiced understanding.

## 2018-06-04 NOTE — Telephone Encounter (Signed)
-----   Message from Sondra Come, MD sent at 06/04/2018  7:46 AM EST ----- He does not have hyperparathyroidism as the cause of his kidney stones. Continue to drink lots of fluid, follow low salt diet, and keep scheduled follow up in one year.  Legrand Rams, MD 06/04/2018

## 2018-10-23 DIAGNOSIS — M25521 Pain in right elbow: Secondary | ICD-10-CM | POA: Insufficient documentation

## 2019-06-10 ENCOUNTER — Ambulatory Visit: Payer: BC Managed Care – PPO | Admitting: Urology

## 2019-06-17 ENCOUNTER — Ambulatory Visit: Payer: BC Managed Care – PPO | Admitting: Urology

## 2021-02-06 ENCOUNTER — Encounter (HOSPITAL_COMMUNITY): Payer: Self-pay | Admitting: *Deleted

## 2021-02-06 ENCOUNTER — Emergency Department (HOSPITAL_COMMUNITY)
Admission: EM | Admit: 2021-02-06 | Discharge: 2021-02-06 | Disposition: A | Discharge status: Home / Self Care | Payer: No Typology Code available for payment source | Attending: Emergency Medicine | Admitting: Emergency Medicine

## 2021-02-06 ENCOUNTER — Emergency Department (HOSPITAL_COMMUNITY): Payer: No Typology Code available for payment source

## 2021-02-06 DIAGNOSIS — X501XXA Overexertion from prolonged static or awkward postures, initial encounter: Secondary | ICD-10-CM | POA: Insufficient documentation

## 2021-02-06 DIAGNOSIS — M5441 Lumbago with sciatica, right side: Secondary | ICD-10-CM

## 2021-02-06 DIAGNOSIS — J45909 Unspecified asthma, uncomplicated: Secondary | ICD-10-CM | POA: Insufficient documentation

## 2021-02-06 DIAGNOSIS — E039 Hypothyroidism, unspecified: Secondary | ICD-10-CM | POA: Diagnosis not present

## 2021-02-06 DIAGNOSIS — Z79899 Other long term (current) drug therapy: Secondary | ICD-10-CM | POA: Diagnosis not present

## 2021-02-06 DIAGNOSIS — M545 Low back pain, unspecified: Secondary | ICD-10-CM | POA: Diagnosis present

## 2021-02-06 DIAGNOSIS — Z87891 Personal history of nicotine dependence: Secondary | ICD-10-CM | POA: Insufficient documentation

## 2021-02-06 DIAGNOSIS — J449 Chronic obstructive pulmonary disease, unspecified: Secondary | ICD-10-CM | POA: Insufficient documentation

## 2021-02-06 IMAGING — CT CT L SPINE W/O CM
3 series · 11 of 33 positions shown, 13 images · non-contrast
Comparison: Radiograph from [DATE].

CLINICAL DATA: Initial evaluation for acute back trauma.

EXAM:
CT LUMBAR SPINE WITHOUT CONTRAST
TECHNIQUE: Multidetector CT imaging of the lumbar spine was performed without
intravenous contrast administration. Multiplanar CT image
reconstructions were also generated.

[Series 4: l spine soft · axial · 0.37mm/px · z∈[+1053,+1245]mm · 3 of 156 slices shown, 4 images]
[im 36/156  soft-tissue]
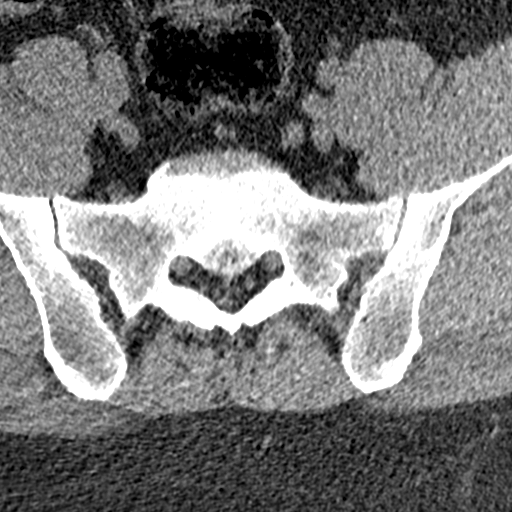
[im 36/156  bone]
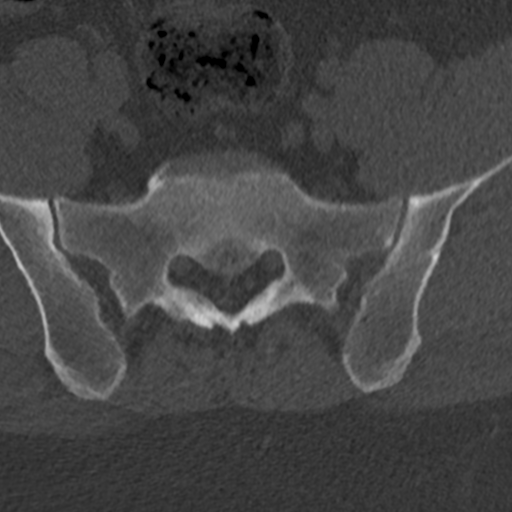
[im 84/156  bone]
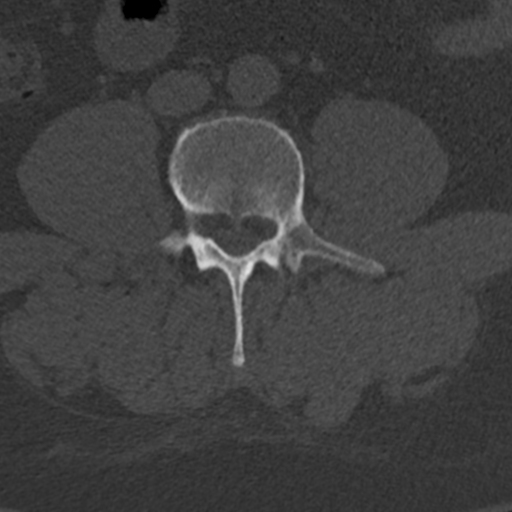
[im 132/156  bone]
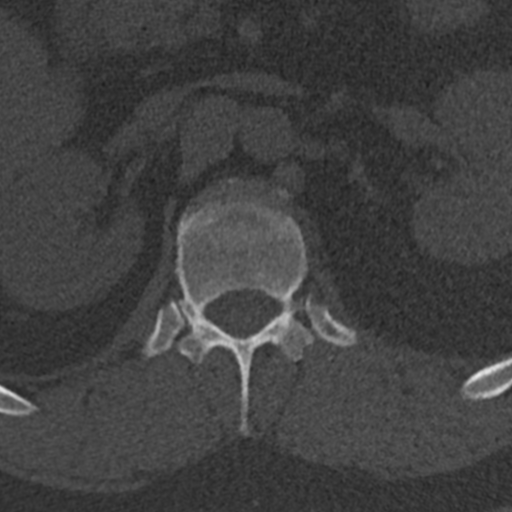

[Series 5: sagittal bone · sagittal · 0.38mm/px · 5 of 68 slices shown, 6 images]
[im 23/68  bone]
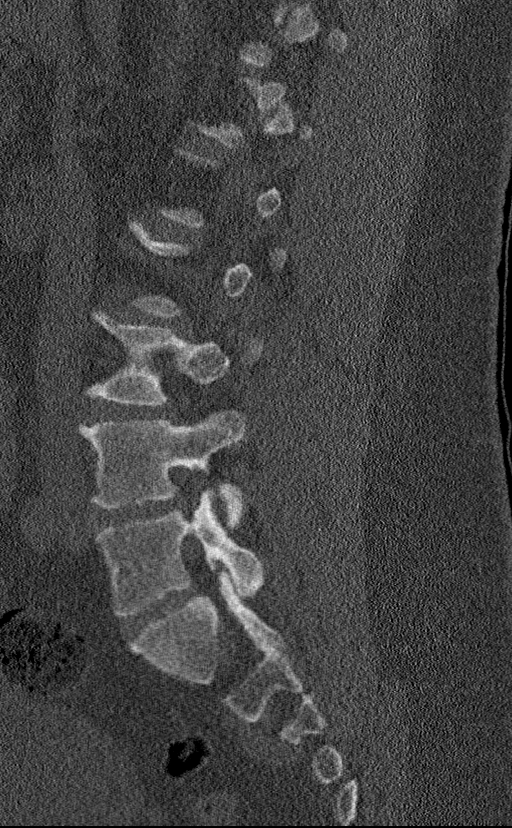
[im 28/68  bone]
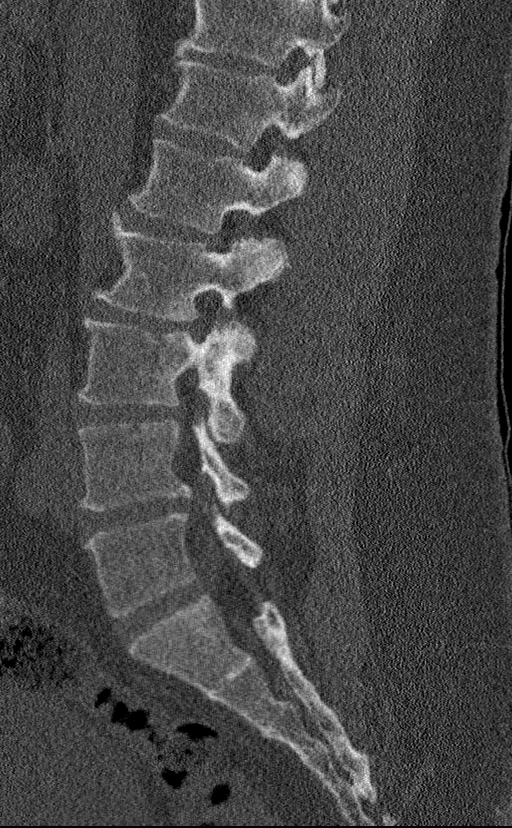
[im 34/68  soft-tissue]
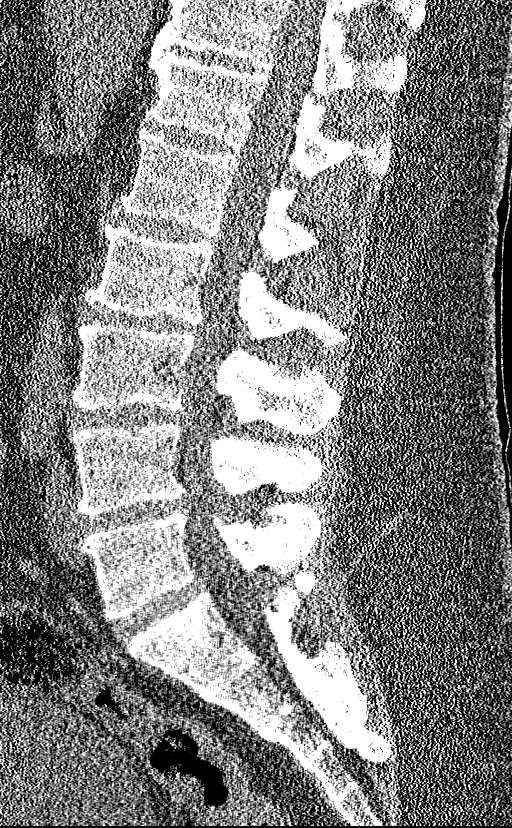
[im 34/68  bone]
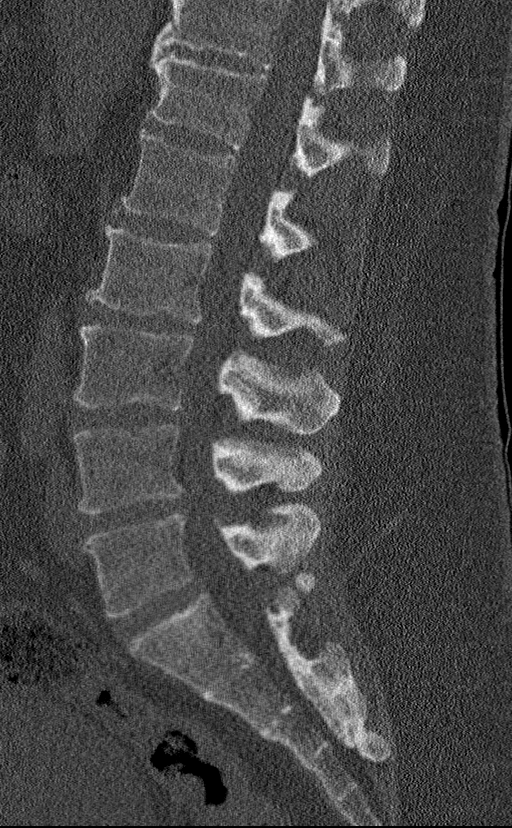
[im 40/68  bone]
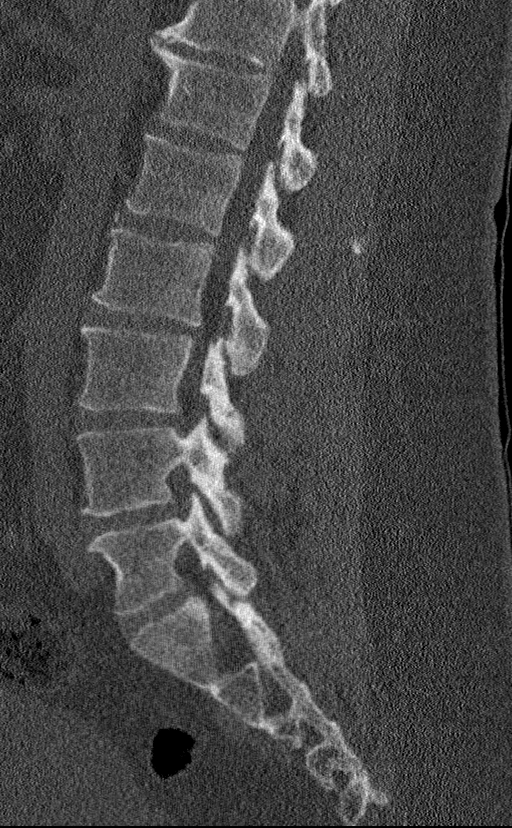
[im 45/68  bone]
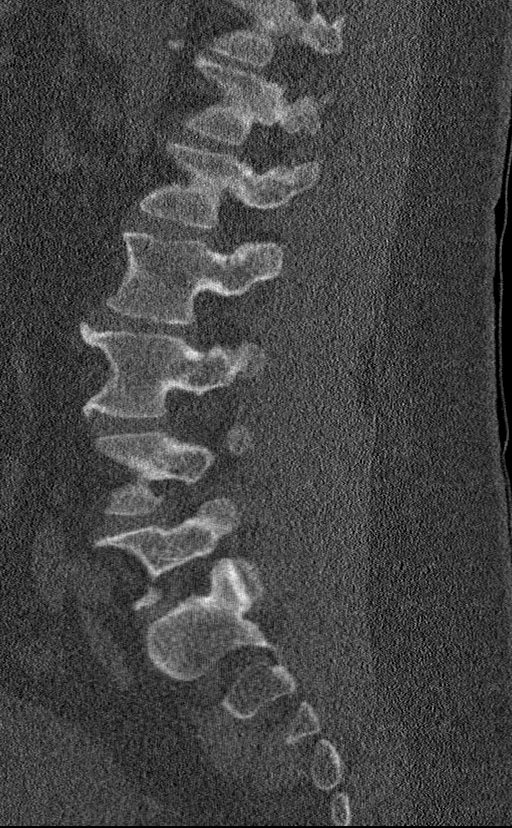

[Series 6: coronal bone · coronal · 0.37mm/px · 3 of 89 slices shown]
[im 18/89  bone]
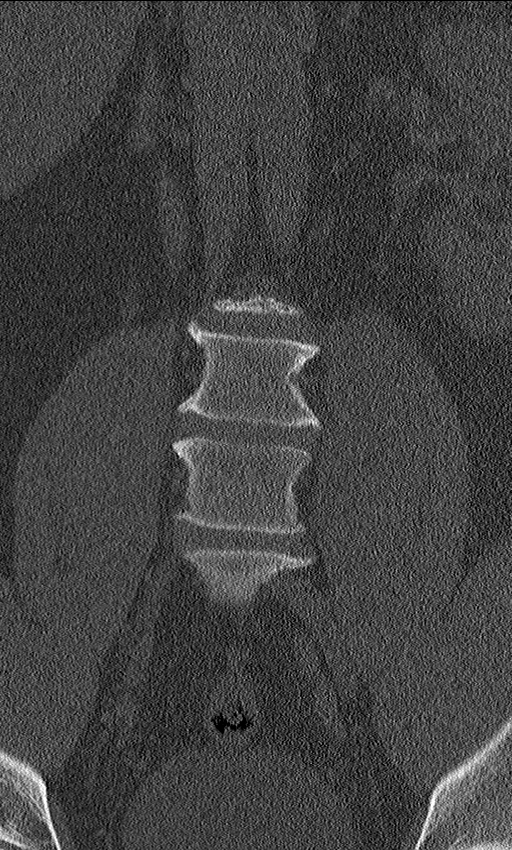
[im 36/89  bone]
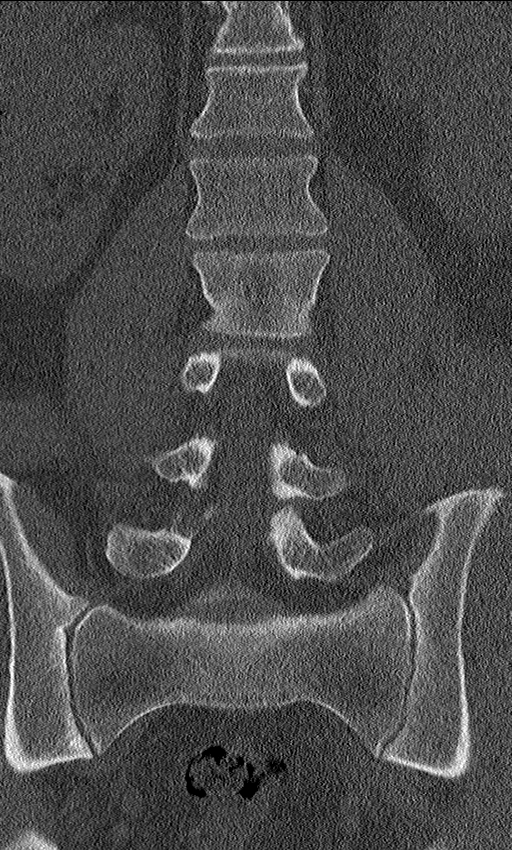
[im 53/89  bone]
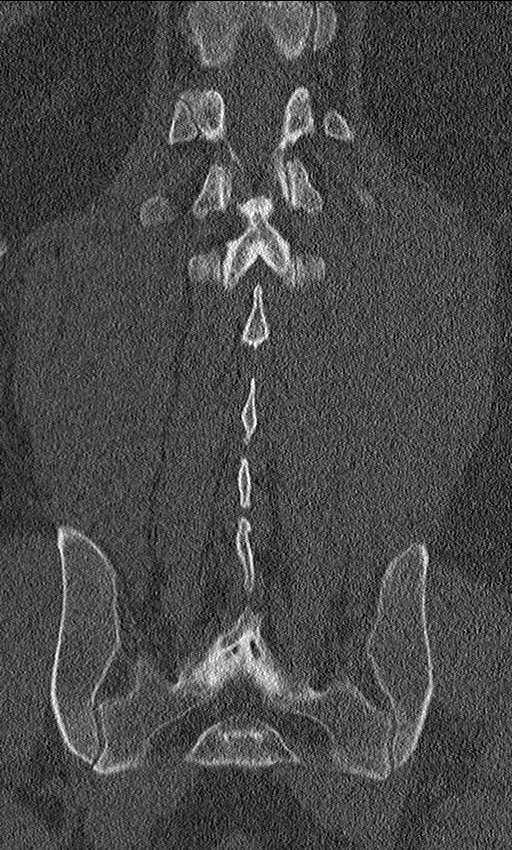

[11 of 33 positions shown; findings below may reference images not displayed]

FINDINGS: Segmentation: Standard. Lowest well-formed disc space labeled the
L5-S1 level.

Alignment: Physiologic with preservation of the normal lumbar
lordosis. No listhesis.

Vertebrae: Vertebral body height maintained without acute or chronic
fracture. Visualized sacrum and pelvis intact. SI joints symmetric
and normal. No discrete or worrisome osseous lesions.

Paraspinal and other soft tissues: Paraspinous soft tissues
demonstrate no acute finding. Nonobstructive right renal
nephrolithiasis noted.

Disc levels:

L1-2:  Negative interspace.  Mild facet hypertrophy.  No stenosis.

L2-3: Degenerative intervertebral disc space narrowing with diffuse
disc bulge. Chronic endplate Schmorl's node deformities with mild
reactive endplate spurring. Mild facet hypertrophy. No significant
spinal stenosis. Foramina remain patent.

L3-4: Degenerative intervertebral disc space narrowing with mild
disc bulge. Small chronic endplate Schmorl's node deformities with
minimal reactive endplate spurring. Mild facet hypertrophy.
Resultant mild spinal stenosis.

L4-5: Mild diffuse disc bulge, slightly eccentric to the right.
Associated mild reactive endplate spurring with annular
calcification. Mild facet and ligament flavum hypertrophy. Resultant
mild spinal stenosis with mild-to-moderate right greater than left
lateral recess narrowing. Mild bilateral L4 foraminal stenosis.

L5-S1: Minimal disc bulge with mild facet hypertrophy. No
significant canal or foraminal stenosis.
IMPRESSION: 1. No acute traumatic injury within the lumbar spine.
2. Mild multilevel degenerative spondylosis with resultant mild
spinal stenosis at L3-4 and L4-5 as above.
3. Nonobstructive right renal nephrolithiasis.

## 2021-02-06 MED ORDER — HYDROMORPHONE HCL 1 MG/ML IJ SOLN
1.0000 mg | Freq: Once | INTRAMUSCULAR | Status: DC
  Filled 2021-02-06: qty 1

## 2021-02-06 MED ORDER — PREDNISONE 10 MG PO TABS: 30.0000 mg | ORAL_TABLET | Freq: Every day | ORAL | 0 refills | Status: AC

## 2021-02-06 MED ORDER — ONDANSETRON 8 MG PO TBDP
8.0000 mg | ORAL_TABLET | Freq: Once | ORAL | Status: AC
  Administered 2021-02-06: 8 mg via ORAL
  Filled 2021-02-06: qty 1

## 2021-02-06 MED ORDER — HYDROMORPHONE HCL 1 MG/ML IJ SOLN
1.0000 mg | Freq: Once | INTRAMUSCULAR | Status: AC
  Administered 2021-02-06: 1 mg via INTRAMUSCULAR

## 2021-02-06 MED ORDER — CYCLOBENZAPRINE HCL 10 MG PO TABS: 10.0000 mg | ORAL_TABLET | Freq: Two times a day (BID) | ORAL | 0 refills | Status: DC | PRN

## 2021-02-06 NOTE — ED Notes (Signed)
Patient transported to CT 

## 2021-02-06 NOTE — ED Provider Notes (Signed)
Pt's results returned and shows bulging disc. Pt counseled on results.  Pt advised to follow up with primary care MD in 3-4 days     Willie Strong 02/06/21 1949    Benjiman Core, MD 02/06/21 [phone]
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
## 2021-02-06 NOTE — ED Notes (Signed)
Pt fitted for crutches with adequate return demonstration. Pt verbalizes understanding of use of crutches.

## 2021-02-06 NOTE — Discharge Instructions (Addendum)
You have been seen here for back pain. I recommend taking over-the-counter pain medications like ibuprofen and/or Tylenol every 6 as needed.  Please follow dosage and on the back of bottle.  I also recommend applying heat to the area and stretching out the muscles as this will help decrease stiffness and pain.  I have given you information on exercises please follow.  Please follow-up with neurosurgery and/or your PCP for further evaluation.  Come back to the emergency department if you develop chest pain, shortness of breath, severe abdominal pain, uncontrolled nausea, vomiting, diarrhea.

## 2021-07-24 ENCOUNTER — Encounter
Admission: RE | Disposition: A | Discharge status: Home / Self Care | Payer: Self-pay | Source: Ambulatory Visit | Attending: Gastroenterology

## 2021-07-24 ENCOUNTER — Ambulatory Visit: Payer: BC Managed Care – PPO | Admitting: Certified Registered"

## 2021-07-24 ENCOUNTER — Ambulatory Visit
Admission: RE | Admit: 2021-07-24 | Discharge: 2021-07-24 | Disposition: A | Discharge status: Home / Self Care | Payer: BC Managed Care – PPO | Source: Ambulatory Visit | Attending: Gastroenterology | Admitting: Gastroenterology

## 2021-07-24 ENCOUNTER — Other Ambulatory Visit: Payer: Self-pay

## 2021-07-24 DIAGNOSIS — F32A Depression, unspecified: Secondary | ICD-10-CM | POA: Insufficient documentation

## 2021-07-24 DIAGNOSIS — Z9884 Bariatric surgery status: Secondary | ICD-10-CM | POA: Diagnosis not present

## 2021-07-24 DIAGNOSIS — J449 Chronic obstructive pulmonary disease, unspecified: Secondary | ICD-10-CM | POA: Diagnosis not present

## 2021-07-24 DIAGNOSIS — Z6841 Body Mass Index (BMI) 40.0 and over, adult: Secondary | ICD-10-CM | POA: Diagnosis not present

## 2021-07-24 DIAGNOSIS — Z79899 Other long term (current) drug therapy: Secondary | ICD-10-CM | POA: Diagnosis not present

## 2021-07-24 DIAGNOSIS — F419 Anxiety disorder, unspecified: Secondary | ICD-10-CM | POA: Insufficient documentation

## 2021-07-24 DIAGNOSIS — Z800 Family history of malignant neoplasm of digestive organs: Secondary | ICD-10-CM | POA: Insufficient documentation

## 2021-07-24 DIAGNOSIS — K640 First degree hemorrhoids: Secondary | ICD-10-CM | POA: Insufficient documentation

## 2021-07-24 DIAGNOSIS — Z9049 Acquired absence of other specified parts of digestive tract: Secondary | ICD-10-CM | POA: Diagnosis not present

## 2021-07-24 DIAGNOSIS — Z87891 Personal history of nicotine dependence: Secondary | ICD-10-CM | POA: Insufficient documentation

## 2021-07-24 DIAGNOSIS — I10 Essential (primary) hypertension: Secondary | ICD-10-CM | POA: Insufficient documentation

## 2021-07-24 DIAGNOSIS — E119 Type 2 diabetes mellitus without complications: Secondary | ICD-10-CM | POA: Insufficient documentation

## 2021-07-24 DIAGNOSIS — Z79890 Hormone replacement therapy (postmenopausal): Secondary | ICD-10-CM | POA: Insufficient documentation

## 2021-07-24 DIAGNOSIS — Z1211 Encounter for screening for malignant neoplasm of colon: Secondary | ICD-10-CM | POA: Insufficient documentation

## 2021-07-24 DIAGNOSIS — K219 Gastro-esophageal reflux disease without esophagitis: Secondary | ICD-10-CM | POA: Insufficient documentation

## 2021-07-24 DIAGNOSIS — E039 Hypothyroidism, unspecified: Secondary | ICD-10-CM | POA: Insufficient documentation

## 2021-07-24 HISTORY — DX: Anxiety disorder, unspecified: F41.9

## 2021-07-24 HISTORY — PX: COLONOSCOPY WITH PROPOFOL: SHX5780

## 2021-07-24 HISTORY — DX: Type 2 diabetes mellitus without complications: E11.9

## 2021-07-24 HISTORY — DX: Depression, unspecified: F32.A

## 2021-07-24 HISTORY — DX: Essential (primary) hypertension: I10

## 2021-07-24 SURGERY — COLONOSCOPY WITH PROPOFOL
Anesthesia: General

## 2021-07-24 MED ORDER — PROPOFOL 500 MG/50ML IV EMUL
INTRAVENOUS | Status: DC | PRN
  Administered 2021-07-24: 125 ug/kg/min via INTRAVENOUS

## 2021-07-24 MED ORDER — PROPOFOL 10 MG/ML IV BOLUS
INTRAVENOUS | Status: DC | PRN
  Administered 2021-07-24: 150 mg via INTRAVENOUS

## 2021-07-24 MED ORDER — SODIUM CHLORIDE 0.9 % IV SOLN: INTRAVENOUS | Status: DC
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
## 2021-07-24 NOTE — Op Note (Signed)
[facility] ?Gastroenterology ?Patient Name: Willie Strong ?Procedure Date: 07/24/2021 11:57 AM ?MRN: [id number] ?Account #: [id number] ?Date of Birth: [birth year] ?Admit Type: Outpatient ?Age: 41 ?Room: [facility] ENDO ROOM 1 ?Gender: Male ?Note Status: Finalized ?Instrument Name: Colonoscope [id number] ?Procedure:             Colonoscopy ?Indications:           Screening in patient at increased risk: Family history  ?                       of 1st-degree relative with colorectal cancer before  ?                       age 55 years ?Providers:             Andrey Farmer MD, MD ?Referring MD:          Glennon Mac ?Medicines:             Monitored Anesthesia Care ?Complications:         No immediate complications. ?Procedure:             Pre-Anesthesia Assessment: ?                       - Prior to the procedure, a History and Physical was  ?                       performed, and patient medications and allergies were  ?                       reviewed. The patient is competent. The risks and  ?                       benefits of the procedure and the sedation options and  ?                       risks were discussed with the patient. All questions  ?                       were answered and informed consent was obtained.  ?                       Patient identification and proposed procedure were  ?                       verified by the physician, the nurse, the  ?                       anesthesiologist, the anesthetist and the technician  ?                       in the endoscopy suite. Mental Status Examination:  ?                       alert and oriented. Airway Examination: normal  ?                       oropharyngeal airway and neck mobility. Respiratory  ?  Examination: clear to auscultation. CV Examination:  ?                       normal. Prophylactic Antibiotics: The patient does not  ?                       require prophylactic antibiotics. Prior  ?                        Anticoagulants: The patient has taken no previous  ?                       anticoagulant or antiplatelet agents. ASA Grade  ?                       Assessment: III - A patient with severe systemic  ?                       disease. After reviewing the risks and benefits, the  ?                       patient was deemed in satisfactory condition to  ?                       undergo the procedure. The anesthesia plan was to use  ?                       monitored anesthesia care (MAC). Immediately prior to  ?                       administration of medications, the patient was  ?                       re-assessed for adequacy to receive sedatives. The  ?                       heart rate, respiratory rate, oxygen saturations,  ?                       blood pressure, adequacy of pulmonary ventilation, and  ?                       response to care were monitored throughout the  ?                       procedure. The physical status of the patient was  ?                       re-assessed after the procedure. ?                       After obtaining informed consent, the colonoscope was  ?                       passed under direct vision. Throughout the procedure,  ?                       the patient's blood pressure, pulse, and oxygen  ?  saturations were monitored continuously. The  ?                       Colonoscope was introduced through the anus and  ?                       advanced to the the cecum, identified by appendiceal  ?                       orifice and ileocecal valve. The colonoscopy was  ?                       performed without difficulty. The patient tolerated  ?                       the procedure well. The quality of the bowel  ?                       preparation was good. ?Findings: ?     The perianal and digital rectal examinations were normal. ?     Internal hemorrhoids were found during retroflexion. The hemorrhoids  ?     were Grade I (internal hemorrhoids that do not prolapse). ?      The exam was otherwise without abnormality on direct and retroflexion  ?     views. ?Impression:            - Internal hemorrhoids. ?                       - The examination was otherwise normal on direct and  ?                       retroflexion views. ?                       - No specimens collected. ?Recommendation:        - Discharge patient to home. ?                       - Resume previous diet. ?                       - Continue present medications. ?                       - Repeat colonoscopy in 5 years for screening purposes. ?                       - Return to referring physician as previously  ?                       scheduled. ?Procedure Code(s):     --- Professional --- ?                       G0105, Colorectal cancer screening; colonoscopy on  ?                       individual at high risk ?Diagnosis Code(s):     --- Professional --- ?  Z80.0, Family history of malignant neoplasm of  ?                       digestive organs ?                       K64.0, First degree hemorrhoids ?CPT copyright 2019 [employer]. All rights reserved. ?The codes documented in this report are preliminary and upon coder review may  ?be revised to meet current compliance requirements. ?Andrey Farmer MD, MD ?07/24/2021 1:05:22 PM ?Number of Addenda: 0 ?Note Initiated On: 07/24/2021 11:57 AM ?Scope Withdrawal Time: 0 hours 8 minutes 13 seconds  ?Total Procedure Duration: 0 hours 14 minutes 21 seconds  ?Estimated Blood Loss:  Estimated blood loss: none. ?     [facility] ?
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
## 2021-07-24 NOTE — Anesthesia Postprocedure Evaluation (Signed)
Anesthesia Post Note ? ?Patient: Willie Strong ? ?Procedure(s) Performed: COLONOSCOPY WITH PROPOFOL ? ?Patient location during evaluation: Endoscopy ?Anesthesia Type: General ?Level of consciousness: awake and alert ?Pain management: pain level controlled ?Vital Signs Assessment: post-procedure vital signs reviewed and stable ?Respiratory status: spontaneous breathing, nonlabored ventilation, respiratory function stable and patient connected to nasal cannula oxygen ?Cardiovascular status: blood pressure returned to baseline and stable ?Postop Assessment: no apparent nausea or vomiting ?Anesthetic complications: no ? ? ?No notable events documented. ? ? ?Last Vitals:  ?Vitals:  ? 07/24/21 1307 07/24/21 1317  ?BP: (!) 114/102 (!) 139/96  ?Pulse: 96 93  ?Resp: 15 19  ?Temp: (!) 36.1 ?C   ?SpO2: 96% 99%  ?  ?Last Pain:  ?Vitals:  ? 07/24/21 1307  ?TempSrc: Temporal  ?PainSc: Asleep  ? ? ?  ?  ?  ?  ?  ?  ? ?Lenard Simmer ? ? ? ? ?

## 2021-07-24 NOTE — Transfer of Care (Signed)
Immediate Anesthesia Transfer of Care Note ? ?Patient: Willie Strong ? ?Procedure(s) Performed: COLONOSCOPY WITH PROPOFOL ? ?Patient Location: PACU ? ?Anesthesia Type:General ? ?Level of Consciousness: awake ? ?Airway & Oxygen Therapy: Patient Spontanous Breathing ? ?Post-op Assessment: Report given to RN ? ?Post vital signs: stable ? ?Last Vitals:  ?Vitals Value Taken Time  ?BP    ?Temp    ?Pulse    ?Resp    ?SpO2    ? ? ?Last Pain:  ?Vitals:  ? 07/24/21 1213  ?TempSrc: Temporal  ?PainSc: 0-No pain  ?   ? ?  ? ?Complications: No notable events documented. ?

## 2021-07-24 NOTE — Interval H&P Note (Signed)
History and Physical Interval Note: ? ?07/24/2021 ?12:35 PM ? ?Willie Strong  has presented today for surgery, with the diagnosis of colon screening for [facility] Colon Cancer mother.  The various methods of treatment have been discussed with the patient and family. After consideration of risks, benefits and other options for treatment, the patient has consented to  Procedure(s): ?COLONOSCOPY WITH PROPOFOL (N/A) as a surgical intervention.  The patient's history has been reviewed, patient examined, no change in status, stable for surgery.  I have reviewed the patient's chart and labs.  Questions were answered to the patient's satisfaction.   ? ? ?Hilton Cork Ajahnae Rathgeber ? ?Ok to proceed with colonoscopy ?

## 2021-07-25 ENCOUNTER — Encounter: Payer: Self-pay | Admitting: Gastroenterology

## 2021-10-31 ENCOUNTER — Emergency Department: Payer: BC Managed Care – PPO

## 2021-10-31 ENCOUNTER — Encounter: Payer: Self-pay | Admitting: Medical Oncology

## 2021-10-31 ENCOUNTER — Emergency Department
Admission: EM | Admit: 2021-10-31 | Discharge: 2021-10-31 | Disposition: A | Discharge status: Home / Self Care | Payer: BC Managed Care – PPO | Attending: Student in an Organized Health Care Education/Training Program | Admitting: Student in an Organized Health Care Education/Training Program

## 2021-10-31 DIAGNOSIS — G4452 New daily persistent headache (NDPH): Secondary | ICD-10-CM | POA: Diagnosis not present

## 2021-10-31 DIAGNOSIS — G43009 Migraine without aura, not intractable, without status migrainosus: Secondary | ICD-10-CM

## 2021-10-31 DIAGNOSIS — R202 Paresthesia of skin: Secondary | ICD-10-CM | POA: Diagnosis not present

## 2021-10-31 DIAGNOSIS — R51 Headache with orthostatic component, not elsewhere classified: Secondary | ICD-10-CM | POA: Diagnosis not present

## 2021-10-31 DIAGNOSIS — J45909 Unspecified asthma, uncomplicated: Secondary | ICD-10-CM | POA: Diagnosis not present

## 2021-10-31 DIAGNOSIS — R519 Headache, unspecified: Secondary | ICD-10-CM | POA: Insufficient documentation

## 2021-10-31 LAB — DIFFERENTIAL
Abs Immature Granulocytes: 0.03 10*3/uL (ref 0.00–0.07)
Basophils Absolute: 0 10*3/uL (ref 0.0–0.1)
Basophils Relative: 0 %
Eosinophils Absolute: 0.1 10*3/uL (ref 0.0–0.5)
Eosinophils Relative: 1 %
Immature Granulocytes: 0 %
Lymphocytes Relative: 21 %
Lymphs Abs: 1.7 10*3/uL (ref 0.7–4.0)
Monocytes Absolute: 0.6 10*3/uL (ref 0.1–1.0)
Monocytes Relative: 8 %
Neutro Abs: 5.8 10*3/uL (ref 1.7–7.7)
Neutrophils Relative %: 70 %

## 2021-10-31 LAB — COMPREHENSIVE METABOLIC PANEL
ALT: 32 U/L (ref 0–44)
AST: 25 U/L (ref 15–41)
Albumin: 4.2 g/dL (ref 3.5–5.0)
Alkaline Phosphatase: 38 U/L (ref 38–126)
Anion gap: 4 — ABNORMAL LOW (ref 5–15)
BUN: 19 mg/dL (ref 6–20)
CO2: 26 mmol/L (ref 22–32)
Calcium: 9.7 mg/dL (ref 8.9–10.3)
Chloride: 108 mmol/L (ref 98–111)
Creatinine, Ser: 1.05 mg/dL (ref 0.61–1.24)
GFR, Estimated: >60 mL/min (ref >60)
Glucose, Bld: 95 mg/dL (ref 70–99)
Potassium: 4.1 mmol/L (ref 3.5–5.1)
Sodium: 138 mmol/L (ref 135–145)
Total Bilirubin: 0.7 mg/dL (ref 0.3–1.2)
Total Protein: 7.6 g/dL (ref 6.5–8.1)

## 2021-10-31 LAB — CBC
HCT: 51.8 % (ref 39.0–52.0)
Hemoglobin: 17.5 g/dL — ABNORMAL HIGH (ref 13.0–17.0)
MCH: 30.6 pg (ref 26.0–34.0)
MCHC: 33.8 g/dL (ref 30.0–36.0)
MCV: 90.7 fL (ref 80.0–100.0)
Platelets: 250 10*3/uL (ref 150–400)
RBC: 5.71 MIL/uL (ref 4.22–5.81)
RDW: 12.1 % (ref 11.5–15.5)
WBC: 8.2 10*3/uL (ref 4.0–10.5)
nRBC: 0 % (ref 0.0–0.2)

## 2021-10-31 LAB — APTT: aPTT: 29 seconds (ref 24–36)

## 2021-10-31 LAB — CBG MONITORING, ED: Glucose-Capillary: 94 mg/dL (ref 70–99)

## 2021-10-31 LAB — PROTIME-INR
INR: 0.9 (ref 0.8–1.2)
Prothrombin Time: 12.5 seconds (ref 11.4–15.2)

## 2021-10-31 LAB — ETHANOL: Alcohol, Ethyl (B): <10 mg/dL (ref <10)

## 2021-10-31 IMAGING — MR MR MRA HEAD W/O CM
1 series · 27 of 48 positions shown · IV contrast (gadavist)
Comparison: Same-day noncontrast CT head.

CLINICAL DATA: Neuro deficit, stroke suspected

EXAM:
MRI HEAD WITHOUT AND WITH CONTRAST
MRA HEAD WITHOUT CONTRAST
TECHNIQUE: Multiplanar, multi-echo pulse sequences of the brain and surrounding
structures were acquired without and with intravenous contrast.
Angiographic images of the Circle of Willis were acquired using MRA
technique without intravenous contrast.
CONTRAST:  10mL GADAVIST GADOBUTROL 1 MMOL/ML IV SOLN

[Series 9: TOF · axial · 0.5mm · 0.48mm/px · z∈[-66,+29]mm · 27 of 217 slices shown]
[im 1/217]
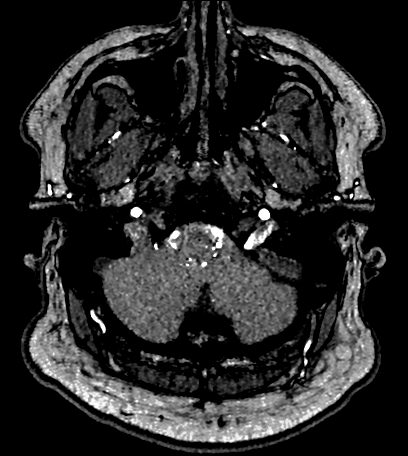
[im 5/217]
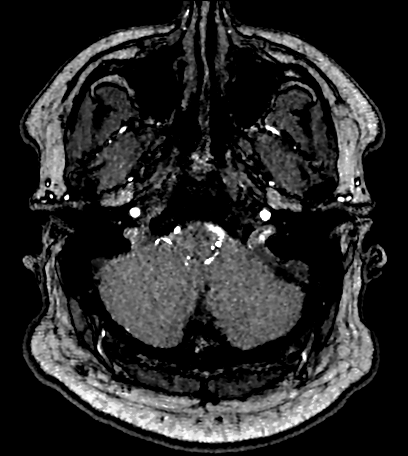
[im 10/217]
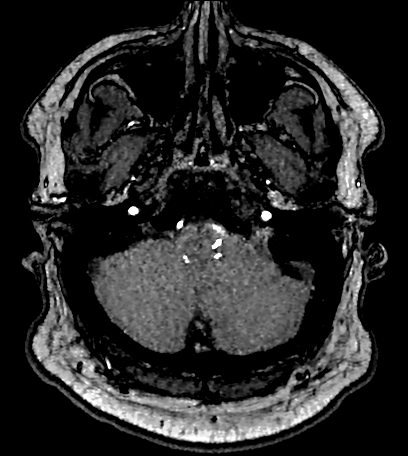
[im 14/217]
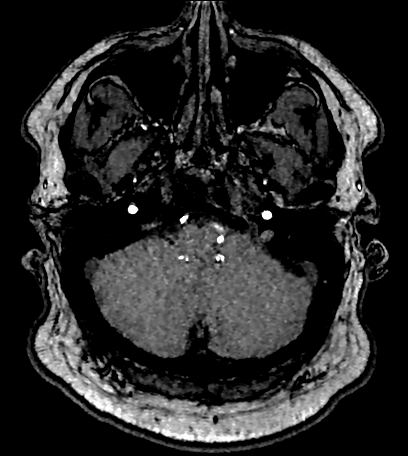
[im 19/217]
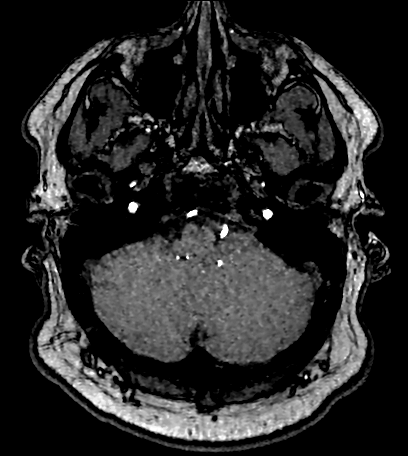
[im 23/217]
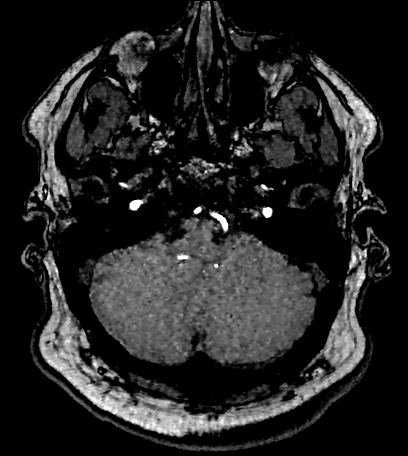
[im 28/217]
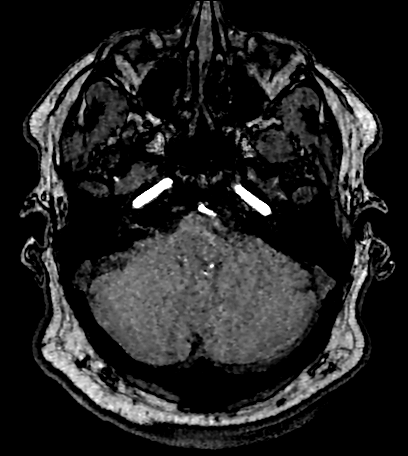
[im 33/217]
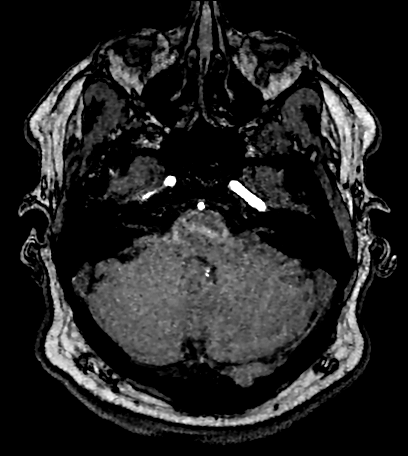
[im 37/217]
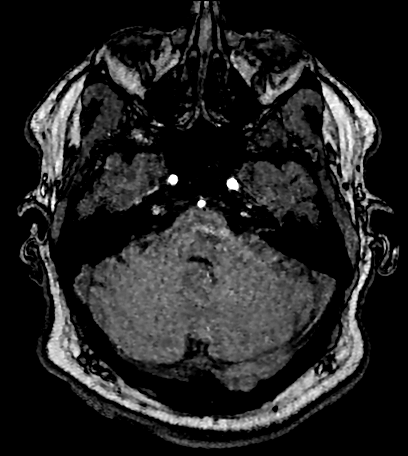
[im 42/217]
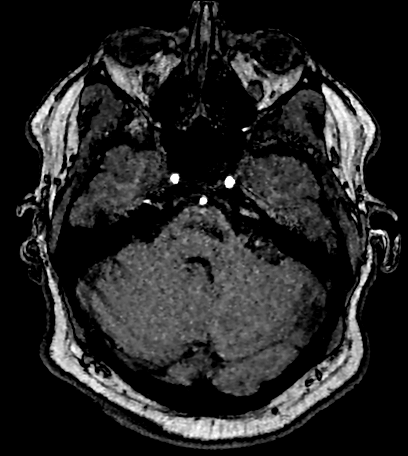
[im 46/217]
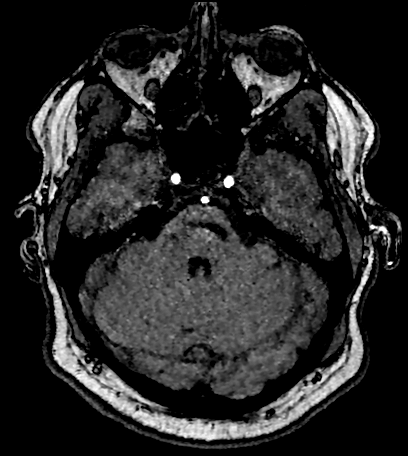
[im 51/217]
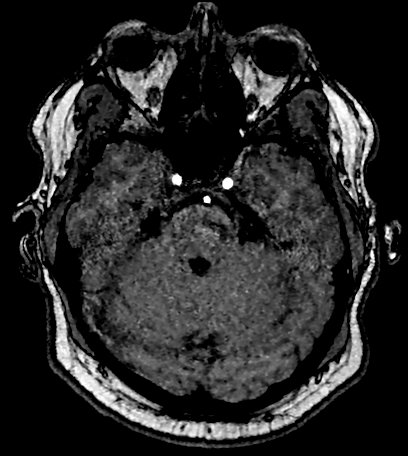
[im 56/217]
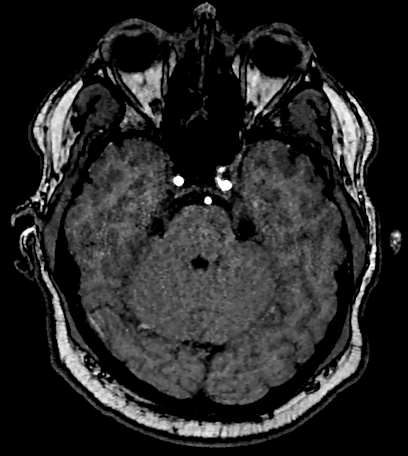
[im 60/217]
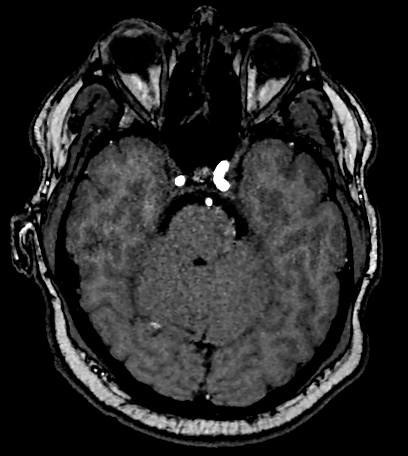
[im 65/217]
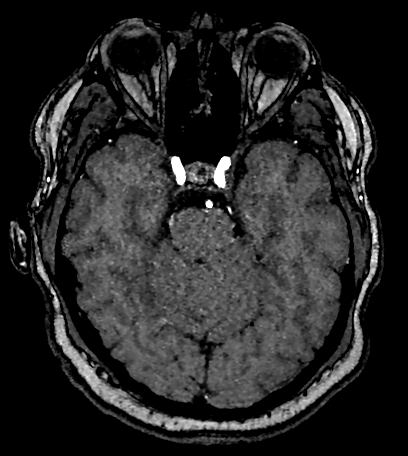
[im 69/217]
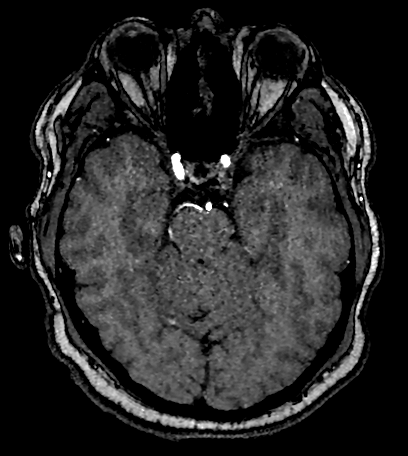
[im 74/217]
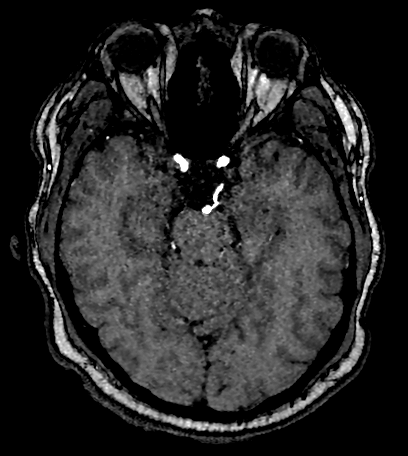
[im 79/217]
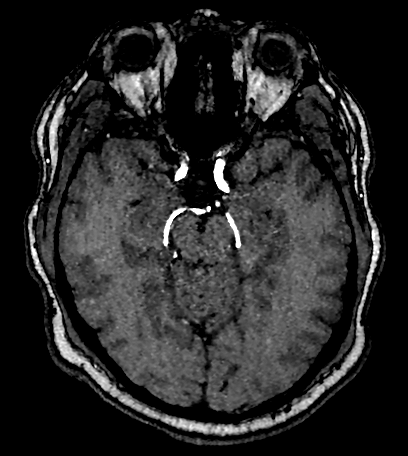
[im 83/217]
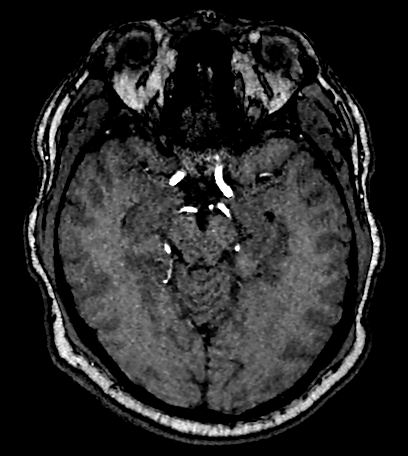
[im 88/217]
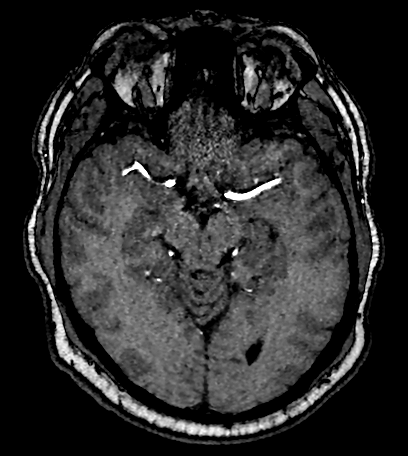
[im 97/217]
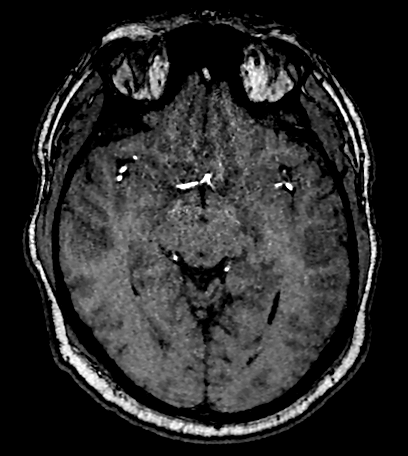
[im 111/217]
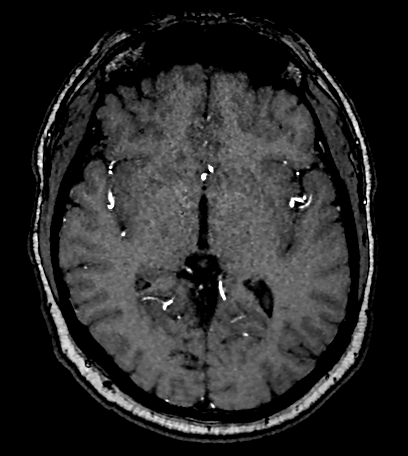
[im 125/217]
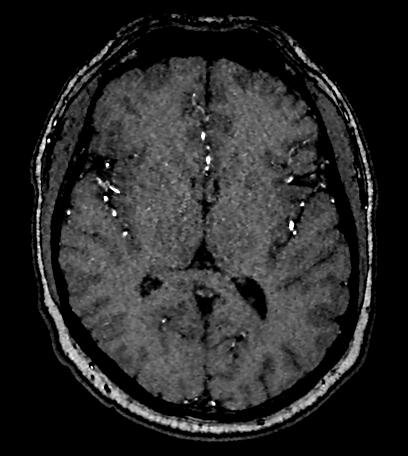
[im 152/217]
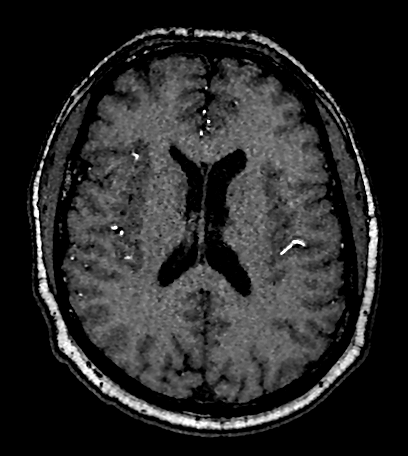
[im 180/217]
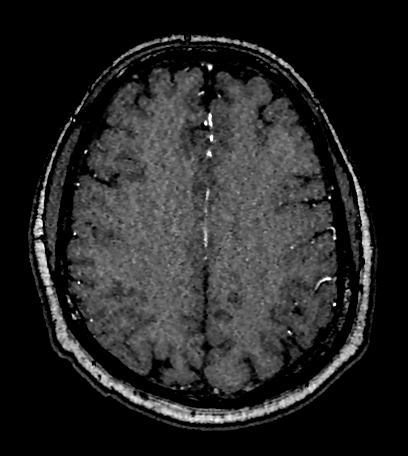
[im 184/217]
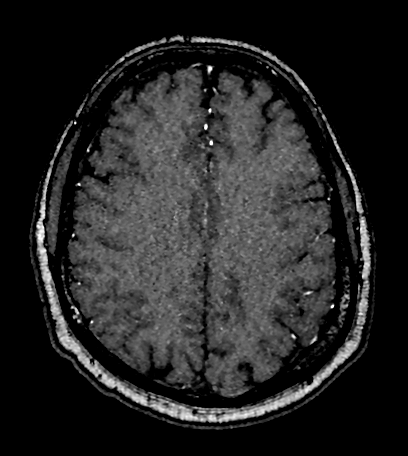
[im 207/217]
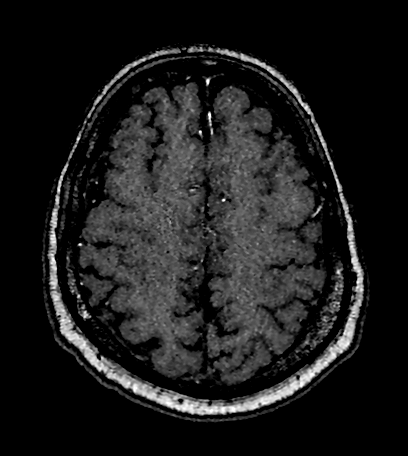

[27 of 48 positions shown; findings below may reference images not displayed]

FINDINGS: MRI HEAD FINDINGS

Brain: There is no acute intracranial hemorrhage, extra-axial fluid
collection, or acute infarct.

Parenchymal volume is normal. The ventricles are normal in size.
Gray-white differentiation is preserved.

Parenchymal signal is normal.

There is no mass lesion. There is no abnormal enhancement. There is
no mass effect or midline shift.

Vascular: The major intracranial flow voids are normal. The
vasculature is better assessed below.

Skull and upper cervical spine: Normal marrow signal.

Sinuses/Orbits: Paranasal sinuses are clear. The globes and orbits
are unremarkable.

Other: None.

MRA HEAD FINDINGS

Anterior circulation: The intracranial ICAs are patent.

The bilateral MCAs are patent.

The bilateral ACAs are patent. The anterior communicating artery is
normal.

There is no aneurysm or AVM.

Posterior circulation: The bilateral V4 segments are patent. PICA is
identified bilaterally, though the origin is not seen on the right.
The basilar artery is patent.

The bilateral PCAs are patent. A prominent left posterior
communicating artery is identified. The right posterior
communicating artery is not seen.

Anatomic variants: As above.
IMPRESSION: 1. Normal brain MRI.
2. Normal intracranial MRA.

## 2021-10-31 IMAGING — MR MR HEAD WO/W CM
14 series · 48 of 48 positions shown · IV contrast (10ml Gadavist)
Comparison: Same-day noncontrast CT head.

CLINICAL DATA: Neuro deficit, stroke suspected

EXAM:
MRI HEAD WITHOUT AND WITH CONTRAST
MRA HEAD WITHOUT CONTRAST
TECHNIQUE: Multiplanar, multi-echo pulse sequences of the brain and surrounding
structures were acquired without and with intravenous contrast.
Angiographic images of the Circle of Willis were acquired using MRA
technique without intravenous contrast.
CONTRAST:  10mL GADAVIST GADOBUTROL 1 MMOL/ML IV SOLN

[Series 5: ax dwi_tracew · axial · 3.0mm · 0.65mm/px · z∈[-70,+82]mm · 3 of 48 slices shown]
[im 1/48]
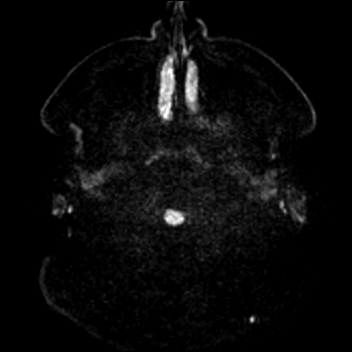
[im 24/48]
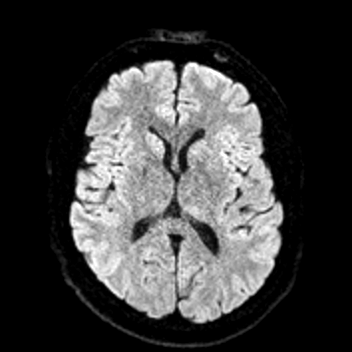
[im 48/48]
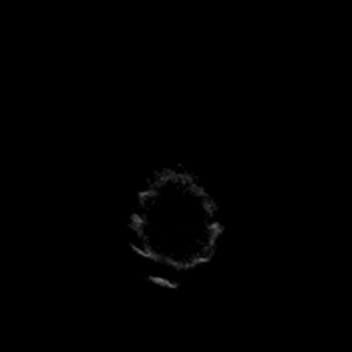

[Series 6: ax dwi_adc · axial · 3.0mm · 0.65mm/px · z∈[-70,+82]mm · 3 of 48 slices shown]
[im 1/48]
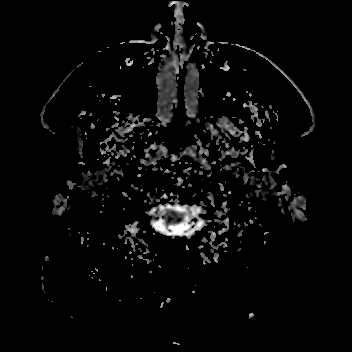
[im 24/48]
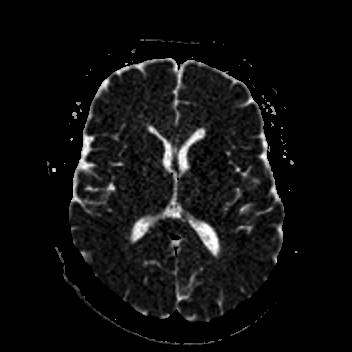
[im 48/48]
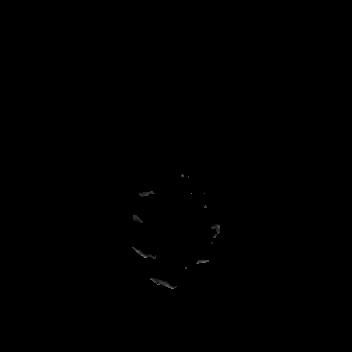

[Series 7: cor dwi_tracew · coronal · 5.0mm · 0.65mm/px · 2 of 40 slices shown]
[im 1/40]
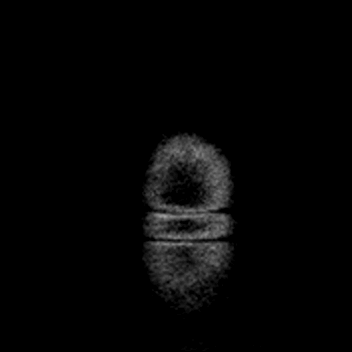
[im 40/40]
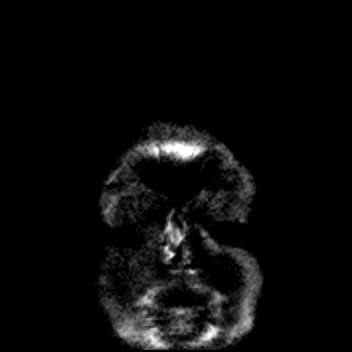

[Series 8: cor dwi_adc · coronal · 5.0mm · 0.65mm/px · 2 of 40 slices shown]
[im 1/40]
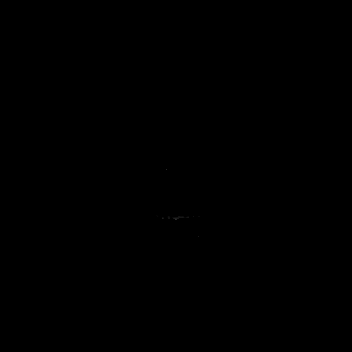
[im 40/40]
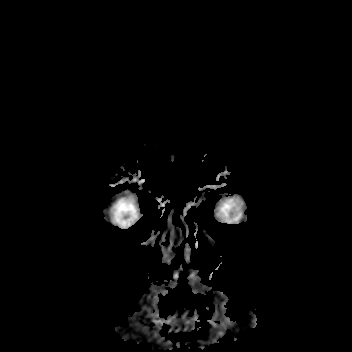

[Series 14: T1 · sagittal · 5.0mm · 0.62mm/px · 1 of 25 slices shown (1 of 2)]
[im 1/25]
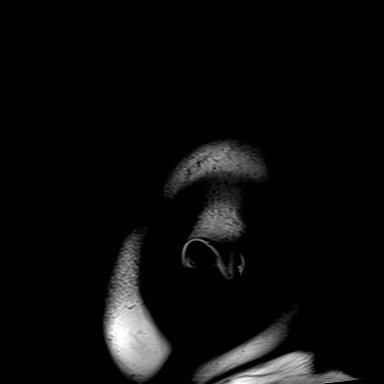

[Series 15: T2 · axial · 5.0mm · 0.53mm/px · 1 of 27 slices shown]
[im 1/27]
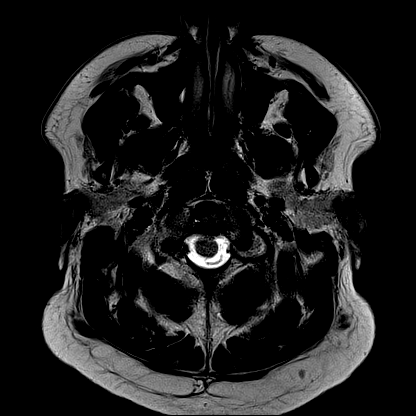

[Series 16: mag_images · axial · 3.0mm · 0.90mm/px · z∈[-80,+92]mm · 3 of 60 slices shown]
[im 1/60]
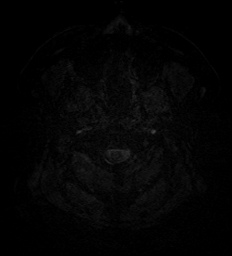
[im 30/60]
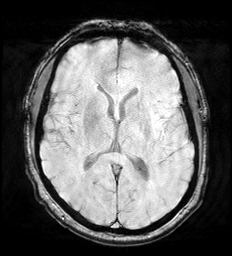
[im 60/60]
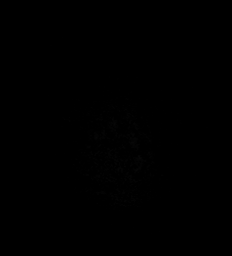

[Series 17: pha_images · axial · 3.0mm · 0.90mm/px · z∈[-80,+89]mm · 3 of 59 slices shown]
[im 1/59]
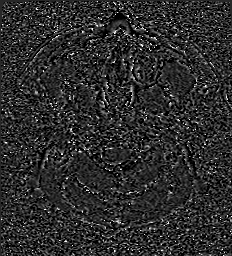
[im 30/59]
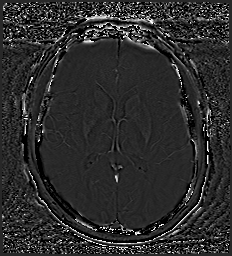
[im 59/59]
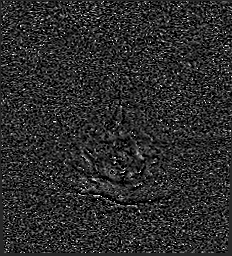

[Series 18: swi_images · axial · 3.0mm · 0.90mm/px · z∈[-80,+92]mm · 3 of 60 slices shown]
[im 1/60]
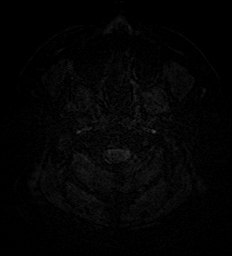
[im 30/60]
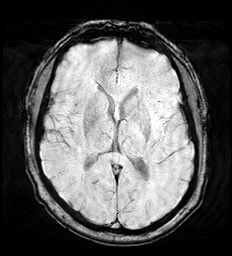
[im 60/60]
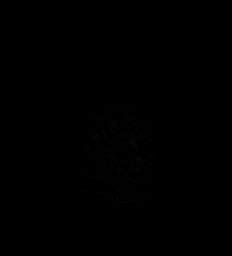

[Series 20: FLAIR · axial · 3.0mm · 0.53mm/px · z∈[-73,+84]mm · 3 of 55 slices shown]
[im 1/55]
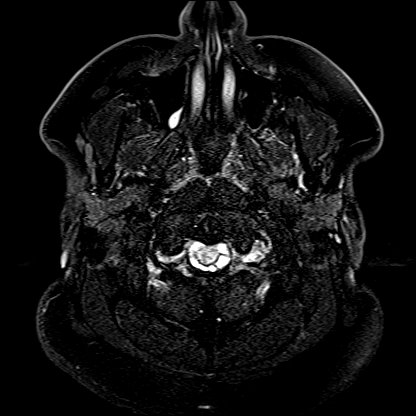
[im 28/55]
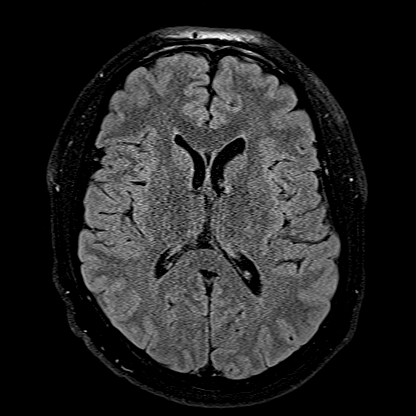
[im 55/55]
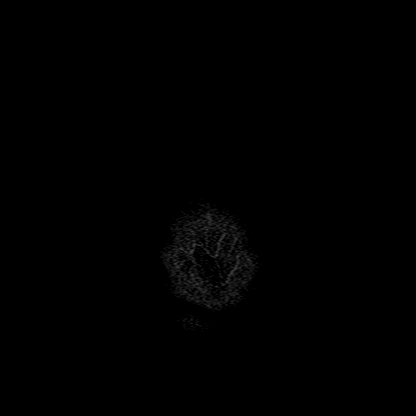

[Series 21: T1 · axial · 1.0mm · 0.98mm/px · z∈[-81,+89]mm · 10 of 176 slices shown (2 of 2)]
[im 1/176]
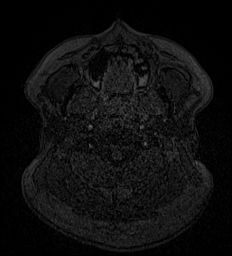
[im 20/176]
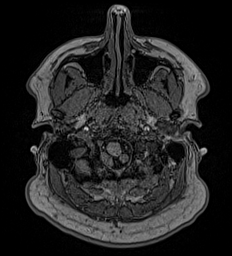
[im 39/176]
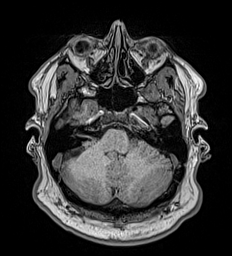
[im 59/176]
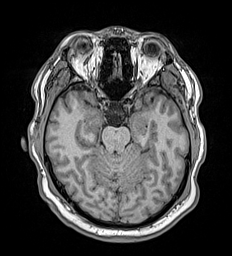
[im 78/176]
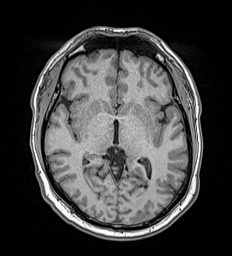
[im 98/176]
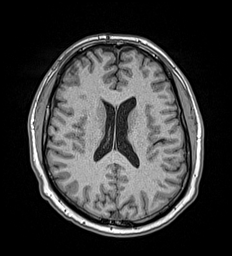
[im 117/176]
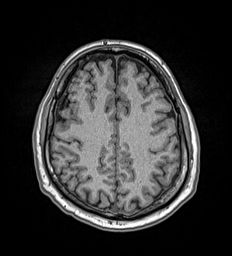
[im 137/176]
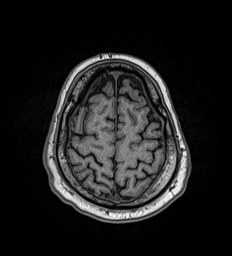
[im 156/176]
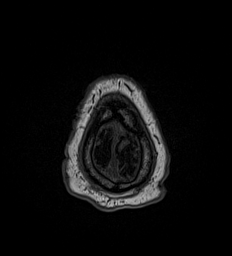
[im 176/176]
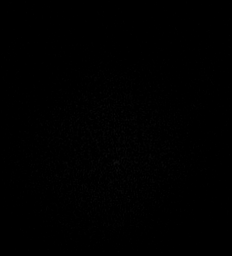

[Series 22: T2 post-contrast · coronal · 5.0mm · 0.57mm/px · 2 of 31 slices shown]
[im 1/31]
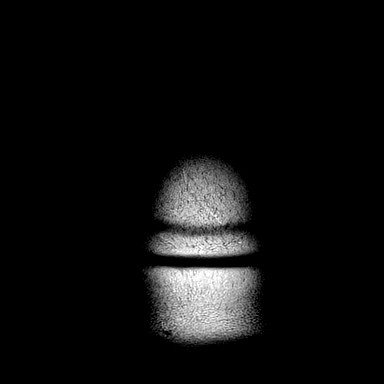
[im 31/31]
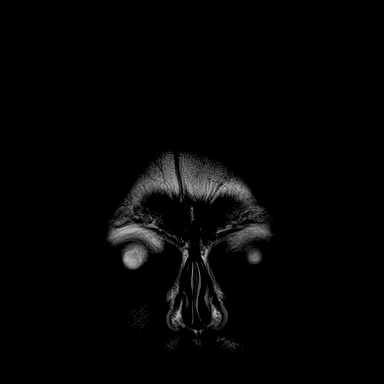

[Series 23: T1 post-contrast · axial · 1.0mm · 0.98mm/px · z∈[-81,+89]mm · 10 of 176 slices shown (1 of 2)]
[im 1/176]
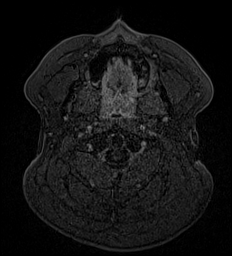
[im 20/176]
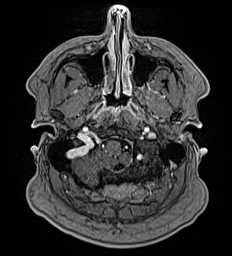
[im 39/176]
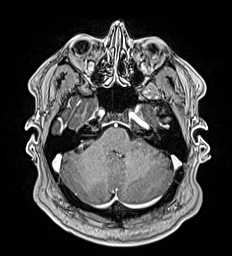
[im 59/176]
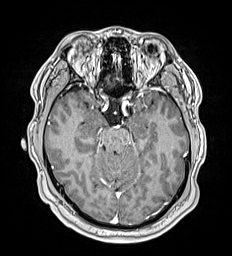
[im 78/176]
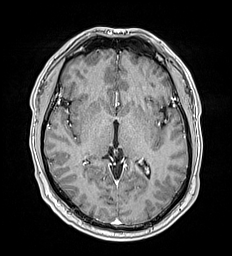
[im 98/176]
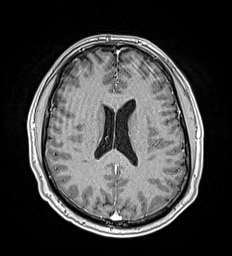
[im 117/176]
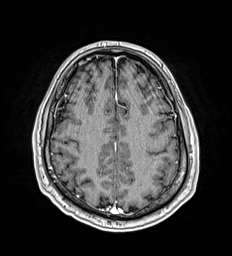
[im 137/176]
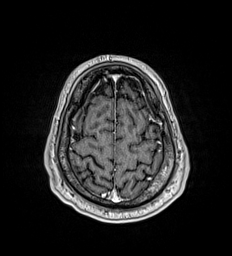
[im 156/176]
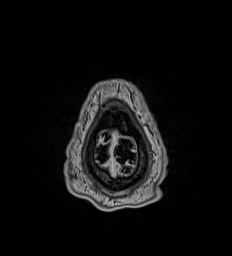
[im 176/176]
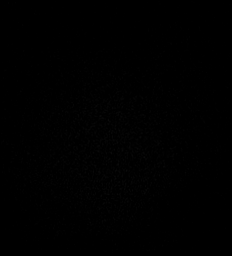

[Series 24: T1 post-contrast · coronal · 5.0mm · 0.69mm/px · 2 of 31 slices shown (2 of 2)]
[im 1/31]
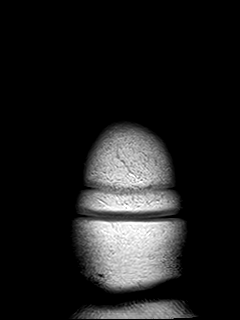
[im 31/31]
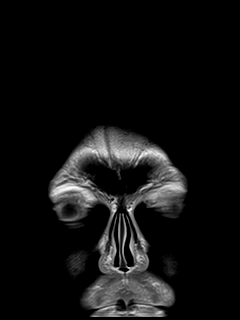

[48 of 48 positions shown; findings below may reference images not displayed]

FINDINGS: MRI HEAD FINDINGS

Brain: There is no acute intracranial hemorrhage, extra-axial fluid
collection, or acute infarct.

Parenchymal volume is normal. The ventricles are normal in size.
Gray-white differentiation is preserved.

Parenchymal signal is normal.

There is no mass lesion. There is no abnormal enhancement. There is
no mass effect or midline shift.

Vascular: The major intracranial flow voids are normal. The
vasculature is better assessed below.

Skull and upper cervical spine: Normal marrow signal.

Sinuses/Orbits: Paranasal sinuses are clear. The globes and orbits
are unremarkable.

Other: None.

MRA HEAD FINDINGS

Anterior circulation: The intracranial ICAs are patent.

The bilateral MCAs are patent.

The bilateral ACAs are patent. The anterior communicating artery is
normal.

There is no aneurysm or AVM.

Posterior circulation: The bilateral V4 segments are patent. PICA is
identified bilaterally, though the origin is not seen on the right.
The basilar artery is patent.

The bilateral PCAs are patent. A prominent left posterior
communicating artery is identified. The right posterior
communicating artery is not seen.

Anatomic variants: As above.
IMPRESSION: 1. Normal brain MRI.
2. Normal intracranial MRA.

## 2021-10-31 MED ORDER — GADOBUTROL 1 MMOL/ML IV SOLN
10.0000 mL | Freq: Once | INTRAVENOUS | Status: AC | PRN
  Administered 2021-10-31: 10 mL via INTRAVENOUS

## 2021-10-31 MED ORDER — ACETAMINOPHEN 325 MG PO TABS
650.0000 mg | ORAL_TABLET | Freq: Once | ORAL | Status: AC
  Administered 2021-10-31: 650 mg via ORAL
  Filled 2021-10-31: qty 2

## 2021-10-31 MED ORDER — NORTRIPTYLINE HCL 10 MG PO CAPS: 10.0000 mg | ORAL_CAPSULE | Freq: Every day | ORAL | 0 refills | Status: DC

## 2021-10-31 MED ORDER — DIPHENHYDRAMINE HCL 50 MG/ML IJ SOLN
12.5000 mg | Freq: Once | INTRAMUSCULAR | Status: AC
  Administered 2021-10-31: 12.5 mg via INTRAVENOUS
  Filled 2021-10-31: qty 1

## 2021-10-31 MED ORDER — PROCHLORPERAZINE EDISYLATE 10 MG/2ML IJ SOLN
10.0000 mg | Freq: Once | INTRAMUSCULAR | Status: AC
  Administered 2021-10-31: 10 mg via INTRAVENOUS
  Filled 2021-10-31: qty 2

## 2021-10-31 MED ORDER — SODIUM CHLORIDE 0.9% FLUSH
3.0000 mL | Freq: Once | INTRAVENOUS | Status: AC
  Administered 2021-10-31: 3 mL via INTRAVENOUS

## 2021-10-31 MED ORDER — ASPIRIN 325 MG PO TABS
325.0000 mg | ORAL_TABLET | Freq: Once | ORAL | Status: DC
  Filled 2021-10-31: qty 1

## 2021-10-31 NOTE — ED Notes (Signed)
Initiated code stroke to [place] at [place]  [phone]

## 2021-10-31 NOTE — ED Triage Notes (Signed)
Pt ambulatory from [facility] with reports of HA x 1 month, this am pt woke up fine and then at 0815 he had sudden left sided facial numbness that has now progressed to the left arm.

## 2021-10-31 NOTE — Progress Notes (Signed)
  Chaplain On-Call responded to Code Stroke notification for ED room 9. The patient was taken to CT Scan, and was unavailable for a visit.  No family is present. Nursing Staff will page the Chaplain if additional support is requested.  Chaplain Evelena Peat M.Div., [facility]

## 2021-10-31 NOTE — ED Provider Notes (Signed)
[facility] Provider Note    Event Date/Time   First MD Initiated Contact with Patient 10/31/21 1015     (approximate)   History   Code Stroke   HPI  Willie Strong is a 42 y.o. male with a history of asthma depression chronic back pain presents to the ER for evaluation of 1 month of headache woke up this morning feeling some tingling sensation in left face as well as left hand.  Has never had symptoms like this before.  Denies any history of headaches.  Denies any fevers no neck stiffness.  No blurry vision.  Given onset of tingling sensation patient was made code stroke in triage.     Physical Exam   Triage Vital Signs: ED Triage Vitals [10/31/21 1005]  Enc Vitals Group     BP (!) 144/95     Pulse Rate (!) 104     Resp 18     Temp 98 F (36.7 C)     Temp Source Oral     SpO2 98 %     Weight (!) 348 lb 5.2 oz (158 kg)     Height 6\' 1"  (1.854 m)     Head Circumference      Peak Flow      Pain Score 0     Pain Loc      Pain Edu?      Excl. in GC?     Most recent vital signs: Vitals:   10/31/21 1030 10/31/21 1100  BP: 116/90 132/75  Pulse: 86 91  Resp: 17 19  Temp:    SpO2: 98% 97%     Constitutional: Alert  Eyes: Conjunctivae are normal.  Head: Atraumatic. Nose: No congestion/rhinnorhea. Mouth/Throat: Mucous membranes are moist.   Neck: Painless ROM.  Cardiovascular:   Good peripheral circulation. Respiratory: Normal respiratory effort.  No retractions.  Gastrointestinal: Soft and nontender.  Musculoskeletal:  no deformity Neurologic:  CN- intact.  No facial droop, Normal FNF.  Normal heel to shin.  Sensation intact bilaterally. Normal speech and language. No gross focal neurologic deficits are appreciated. No gait instability. Skin:  Skin is warm, dry and intact. No rash noted. Psychiatric: Mood and affect are normal. Speech and behavior are normal.    ED Results / Procedures / Treatments   Labs (all labs ordered are  listed, but only abnormal results are displayed) Labs Reviewed  CBC - Abnormal; Notable for the following components:      Result Value   Hemoglobin 17.5 (*)    All other components within normal limits  COMPREHENSIVE METABOLIC PANEL - Abnormal; Notable for the following components:   Anion gap 4 (*)    All other components within normal limits  PROTIME-INR  APTT  DIFFERENTIAL  ETHANOL  CBG MONITORING, ED  CBG MONITORING, ED     EKG  ED ECG REPORT I, 11/02/21, the attending physician, personally viewed and interpreted this ECG.   Date: 10/31/2021  EKG Time: 10:17  Rate: 135  Rhythm: sinus  Axis: normal  Intervals:normal  ST&T Change: no depressions, no stemi    RADIOLOGY Please see ED Course for my review and interpretation.  I personally reviewed all radiographic images ordered to evaluate for the above acute complaints and reviewed radiology reports and findings.  These findings were personally discussed with the patient.  Please see medical record for radiology report.    PROCEDURES:  Critical Care performed:   Procedures   MEDICATIONS ORDERED IN  ED: Medications  aspirin tablet 325 mg (has no administration in time range)  sodium chloride flush (NS) 0.9 % injection 3 mL (3 mLs Intravenous Given 10/31/21 1031)  prochlorperazine (COMPAZINE) injection 10 mg (10 mg Intravenous Given 10/31/21 1032)  diphenhydrAMINE (BENADRYL) injection 12.5 mg (12.5 mg Intravenous Given 10/31/21 1032)  acetaminophen (TYLENOL) tablet 650 mg (650 mg Oral Given 10/31/21 1030)  diphenhydrAMINE (BENADRYL) injection 12.5 mg (12.5 mg Intravenous Given 10/31/21 1105)  gadobutrol (GADAVIST) 1 MMOL/ML injection 10 mL (10 mLs Intravenous Contrast Given 10/31/21 1202)     IMPRESSION / MDM / ASSESSMENT AND PLAN / ED COURSE  I reviewed the triage vital signs and the nursing notes.                              Differential diagnosis includes, but is not limited to, cva, tia,  hypoglycemia, dehydration, electrolyte abnormality, dissection, sepsis   Patient presented to the ER for evaluation of headache and numbness tingling as described above.  Patient taken emergently to CT scan made code stroke.  This presenting complaint could reflect a potentially life-threatening illness therefore the patient will be placed on continuous pulse oximetry and telemetry for monitoring.  Laboratory evaluation will be sent to evaluate for the above complaints.  Given duration of does not seem consistent with SAH or infectious process.  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    Clinical Course as of 10/31/21 1234  Wed Oct 31, 2021  1013 CT head by my interpretation shows no evidence of bleed. [PR]  1028 In consultation with Dr. Selina Cooley of neurology has recommended MRI with and without and MRA.  Will be treated for complex migraine.  If MRI is reassuring would be appropriate for outpatient follow-up. [PR]  1221 Willie Strong MRI is normal.  He does appear stable appropriate for outpatient follow-up. [PR]    Clinical Course User Index [PR] Willy Eddy, MD       FINAL CLINICAL IMPRESSION(S) / ED DIAGNOSES   Final diagnoses:  Acute nonintractable headache, unspecified headache type     Rx / DC Orders   ED Discharge Orders          Ordered    nortriptyline (PAMELOR) 10 MG capsule  Daily at bedtime        10/31/21 1228             Note:  This document was prepared using Dragon voice recognition software and may include unintentional dictation errors.    Willy Eddy, MD 10/31/21 1234

## 2021-10-31 NOTE — Discharge Instructions (Signed)
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
## 2022-06-11 ENCOUNTER — Encounter: Payer: Self-pay | Admitting: *Deleted

## 2022-06-18 ENCOUNTER — Other Ambulatory Visit
Admission: RE | Admit: 2022-06-18 | Discharge: 2022-06-18 | Disposition: A | Discharge status: Home / Self Care | Payer: BC Managed Care – PPO | Attending: Cardiology | Admitting: Cardiology

## 2022-06-18 ENCOUNTER — Ambulatory Visit: Payer: BC Managed Care – PPO | Attending: Cardiology | Admitting: Cardiology

## 2022-06-18 ENCOUNTER — Encounter: Payer: Self-pay | Admitting: Cardiology

## 2022-06-18 VITALS — BP 122/92 | HR 86 | Ht 73.0 in | Wt 358.2 lb

## 2022-06-18 DIAGNOSIS — R072 Precordial pain: Secondary | ICD-10-CM

## 2022-06-18 DIAGNOSIS — R0683 Snoring: Secondary | ICD-10-CM

## 2022-06-18 DIAGNOSIS — R079 Chest pain, unspecified: Secondary | ICD-10-CM | POA: Insufficient documentation

## 2022-06-18 DIAGNOSIS — R0602 Shortness of breath: Secondary | ICD-10-CM | POA: Insufficient documentation

## 2022-06-18 LAB — BASIC METABOLIC PANEL
Anion gap: 4 — ABNORMAL LOW (ref 5–15)
BUN: 12 mg/dL (ref 6–20)
CO2: 25 mmol/L (ref 22–32)
Calcium: 8.9 mg/dL (ref 8.9–10.3)
Chloride: 106 mmol/L (ref 98–111)
Creatinine, Ser: 1.06 mg/dL (ref 0.61–1.24)
GFR, Estimated: >60 mL/min (ref >60)
Glucose, Bld: 96 mg/dL (ref 70–99)
Potassium: 4.4 mmol/L (ref 3.5–5.1)
Sodium: 135 mmol/L (ref 135–145)

## 2022-06-18 MED ORDER — METOPROLOL TARTRATE 100 MG PO TABS
ORAL_TABLET | ORAL | 0 refills | Status: DC
Start: 2022-06-18 — End: 2022-08-27

## 2022-06-18 MED ORDER — IVABRADINE HCL 5 MG PO TABS
ORAL_TABLET | ORAL | 0 refills | Status: DC
Start: 2022-06-18 — End: 2022-08-27

## 2022-06-18 NOTE — Patient Instructions (Signed)
Medication Instructions:   Your physician recommends that you continue on your current medications as directed. Please refer to the Current Medication list given to you today.  *If you need a refill on your cardiac medications before your next appointment, please call your pharmacy*   Lab Work:  Your physician recommends you go to the medical mall for lab work.   If you have labs (blood work) drawn today and your tests are completely normal, you will receive your results only by: [place] (if you have MyChart) OR A paper copy in the mail If you have any lab test that is abnormal or we need to change your treatment, we will call you to review the results.   Testing/Procedures:  Your physician has requested that you have an echocardiogram. Echocardiography is a painless test that uses sound waves to create images of your heart. It provides your doctor with information about the size and shape of your heart and how well your heart's chambers and valves are working. This procedure takes approximately one hour. There are no restrictions for this procedure. Please do NOT wear cologne, perfume, aftershave, or lotions (deodorant is allowed). Please arrive 15 minutes prior to your appointment time.    Your cardiac CT will be scheduled on Febuary 19th 2024 @ 11:00am  [facility] [place], [place] [address] [phone]  [facility], please arrive 15 mins early for check-in and test prep.   Please follow these instructions carefully (unless otherwise directed):  Hold all erectile dysfunction medications at least 3 days (72 hrs) prior to test. (Ie viagra, cialis, sildenafil, tadalafil, etc) We will administer nitroglycerin during this exam.   On the Night Before the Test: Be sure to Drink plenty of water. Do not consume any caffeinated/decaffeinated beverages or chocolate 12 hours prior to your test. Do not take any  antihistamines 12 hours prior to your test.  On the Day of the Test: Drink plenty of water until 1 hour prior to the test. Do not eat any food 1 hour prior to test. You may take your regular medications prior to the test.  Take metoprolol (Lopressor) 100 mg two hours prior to test. Take Corlanor 10 mg by mouth two hours prior to test  After the Test: Drink plenty of water. After receiving IV contrast, you may experience a mild flushed feeling. This is normal. On occasion, you may experience a mild rash up to 24 hours after the test. This is not dangerous. If this occurs, you can take Benadryl 25 mg and increase your fluid intake. If you experience trouble breathing, this can be serious. If it is severe call 911 IMMEDIATELY. If it is mild, please call our office.   For scheduling needs, including cancellations and rescheduling, please call [place], [phone].   Follow-Up: At [facility], you and your health needs are our priority.  As part of our continuing mission to provide you with exceptional heart care, we have created designated Provider Care Teams.  These Care Teams include your primary Cardiologist (physician) and Advanced Practice Providers (APPs -  Physician Assistants and Nurse Practitioners) who all work together to provide you with the care you need, when you need it.  We recommend signing up for the patient portal called "MyChart".  Sign up information is provided on this After Visit Summary.  MyChart is used to connect with patients for Virtual Visits (Telemedicine).  Patients are able to view lab/test results, encounter notes,  upcoming appointments, etc.  Non-urgent messages can be sent to your provider as well.   To learn more about what you can do with MyChart, go to [email or web address].    Your next appointment:   10 weeks  Provider:   You may see Kate Sable, MD or one of the following Advanced Practice Providers on your designated Care Team:    Murray Hodgkins, NP Christell Faith, PA-C Cadence Kathlen Mody, PA-C Gerrie Nordmann, NP
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
## 2022-06-19 ENCOUNTER — Ambulatory Visit: Payer: BC Managed Care – PPO | Attending: Cardiology

## 2022-06-19 DIAGNOSIS — R079 Chest pain, unspecified: Secondary | ICD-10-CM | POA: Diagnosis not present

## 2022-06-19 LAB — ECHOCARDIOGRAM COMPLETE
AR max vel: 3.76 cm2
AV Area VTI: 4.52 cm2
AV Area mean vel: 4.01 cm2
AV Mean grad: 3 mmHg
AV Peak grad: 4.8 mmHg
Ao pk vel: 1.09 m/s
Area-P 1/2: 4.44 cm2
S' Lateral: 3.85 cm
Single Plane A4C EF: 58.8 %

## 2022-06-19 MED ORDER — PERFLUTREN LIPID MICROSPHERE
1.0000 mL | INTRAVENOUS | Status: AC | PRN
  Administered 2022-06-19: 2 mL via INTRAVENOUS

## 2022-07-05 ENCOUNTER — Telehealth (HOSPITAL_COMMUNITY): Payer: Self-pay | Admitting: *Deleted

## 2022-07-05 NOTE — Telephone Encounter (Signed)
Reaching out to patient to offer assistance regarding upcoming cardiac imaging study; pt verbalizes understanding of appt date/time, parking situation and where to check in, pre-test NPO status and medications ordered, and verified current allergies; name and call back number provided for further questions should they arise  Gordy Clement RN Navigator Cardiac [place] [facility] [phone] office [phone] cell  Patient to take 124m metoprolol tartrate and 135mivabradine two hours prior to his cardiac CT scan.

## 2022-07-08 ENCOUNTER — Ambulatory Visit
Admission: RE | Admit: 2022-07-08 | Discharge: 2022-07-08 | Disposition: A | Discharge status: Home / Self Care | Payer: BC Managed Care – PPO | Source: Ambulatory Visit | Attending: Cardiology | Admitting: Cardiology

## 2022-07-08 ENCOUNTER — Ambulatory Visit: Admission: RE | Admit: 2022-07-08 | Payer: BC Managed Care – PPO | Source: Ambulatory Visit

## 2022-07-08 DIAGNOSIS — R072 Precordial pain: Secondary | ICD-10-CM

## 2022-07-08 DIAGNOSIS — R079 Chest pain, unspecified: Secondary | ICD-10-CM | POA: Insufficient documentation

## 2022-07-08 MED ORDER — DILTIAZEM HCL 25 MG/5ML IV SOLN
10.0000 mg | Freq: Once | INTRAVENOUS | Status: AC
  Administered 2022-07-08: 10 mg via INTRAVENOUS

## 2022-07-08 MED ORDER — IOHEXOL 350 MG/ML SOLN
100.0000 mL | Freq: Once | INTRAVENOUS | Status: AC | PRN
  Administered 2022-07-08: 100 mL via INTRAVENOUS

## 2022-07-08 MED ORDER — DILTIAZEM HCL 25 MG/5ML IV SOLN: 5.0000 mg | Freq: Once | INTRAVENOUS | Status: DC

## 2022-07-08 MED ORDER — METOPROLOL TARTRATE 5 MG/5ML IV SOLN
10.0000 mg | Freq: Once | INTRAVENOUS | Status: AC
  Administered 2022-07-08: 10 mg via INTRAVENOUS

## 2022-07-08 MED ORDER — NITROGLYCERIN 0.4 MG SL SUBL
0.8000 mg | SUBLINGUAL_TABLET | Freq: Once | SUBLINGUAL | Status: AC
  Administered 2022-07-08: 0.8 mg via SUBLINGUAL

## 2022-07-08 NOTE — Progress Notes (Signed)
Patient tolerated procedure well. Ambulate w/o difficulty. Denies any lightheadedness or being dizzy. Pt denies any pain at this time. Sitting in chair, pt is encouraged to drink additional water throughout the day and reason explained to patient. Patient verbalized understanding and all questions answered. ABC intact. No further needs at this time. Discharge from procedure area w/o issues.  

## 2022-08-27 ENCOUNTER — Ambulatory Visit: Payer: BC Managed Care – PPO | Attending: Cardiology | Admitting: Cardiology

## 2022-08-27 ENCOUNTER — Encounter: Payer: Self-pay | Admitting: Cardiology

## 2022-08-27 VITALS — BP 130/92 | HR 102 | Ht 73.0 in | Wt 354.0 lb

## 2022-08-27 DIAGNOSIS — R0683 Snoring: Secondary | ICD-10-CM | POA: Diagnosis not present

## 2022-08-27 DIAGNOSIS — R072 Precordial pain: Secondary | ICD-10-CM | POA: Diagnosis not present

## 2022-08-27 DIAGNOSIS — R030 Elevated blood-pressure reading, without diagnosis of hypertension: Secondary | ICD-10-CM | POA: Diagnosis not present
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
## 2022-08-27 NOTE — Patient Instructions (Signed)
Medication Instructions:   Your physician recommends that you continue on your current medications as directed. Please refer to the Current Medication list given to you today.  *If you need a refill on your cardiac medications before your next appointment, please call your pharmacy*   Lab Work:  None Ordered  If you have labs (blood work) drawn today and your tests are completely normal, you will receive your results only by: MyChart Message (if you have MyChart) OR A paper copy in the mail If you have any lab test that is abnormal or we need to change your treatment, we will call you to review the results.   Testing/Procedures:  1., None Ordered   Follow-Up: At [facility], you and your health needs are our priority.  As part of our continuing mission to provide you with exceptional heart care, we have created designated Provider Care Teams.  These Care Teams include your primary Cardiologist (physician) and Advanced Practice Providers (APPs -  Physician Assistants and Nurse Practitioners) who all work together to provide you with the care you need, when you need it.  We recommend signing up for the patient portal called "MyChart".  Sign up information is provided on this After Visit Summary.  MyChart is used to connect with patients for Virtual Visits (Telemedicine).  Patients are able to view lab/test results, encounter notes, upcoming appointments, etc.  Non-urgent messages can be sent to your provider as well.   To learn more about what you can do with MyChart, go to [email or web address].    Your next appointment:    As needed

## 2022-09-11 DIAGNOSIS — S63612A Unspecified sprain of right middle finger, initial encounter: Secondary | ICD-10-CM | POA: Insufficient documentation
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
## 2022-09-17 NOTE — Patient Instructions (Addendum)
  Sleep apnea is defined as period of 10 seconds or longer when you stop breathing at night. This can happen multiple times a night. Dx sleep apnea is when this occurs more than 5 times an hour.    Mild OSA 5-15 apneic events an hour Moderate OSA 15-30 apneic events an hour Severe OSA > 30 apneic events an hour   Untreated sleep apnea puts you at higher risk for cardiac arrhythmias, pulmonary HTN, stroke and diabetes  Treatment options include weight loss, side sleeping position, oral appliance, CPAP therapy or referral to ENT for possible surgical options    Recommendations: - Focus on side sleeping position or elevate head with wedge pillow 30 degrees - Work on weight loss efforts if able  - Do not drive if experiencing excessive daytime sleepiness of fatigue  - If having daily wheezing symptoms/shortness of breath use Stiolto daily 2 puffs as prescribed  - Use nasal spray like flonase/ nasacort plus or minus saline nasal rinses before bed for nasal congestion  - Try diphenhydramine (Zzquil) 50mg  at bedtime to help with sleep    Orders: -Home sleep study re: loud snoring (ordered)    Follow-up: - Please call to schedule follow-up 1-2 weeks after completing home sleep study to review results and treatment if needed (can be virtual)

## 2022-09-20 ENCOUNTER — Ambulatory Visit: Payer: BC Managed Care – PPO | Admitting: Primary Care

## 2022-09-20 VITALS — BP 124/86 | HR 101 | Temp 97.8°F | Ht 73.0 in | Wt 352.2 lb

## 2022-09-20 DIAGNOSIS — R0683 Snoring: Secondary | ICD-10-CM | POA: Diagnosis not present

## 2022-09-20 DIAGNOSIS — J321 Chronic frontal sinusitis: Secondary | ICD-10-CM

## 2022-09-20 DIAGNOSIS — J4520 Mild intermittent asthma, uncomplicated: Secondary | ICD-10-CM

## 2022-09-20 DIAGNOSIS — G4700 Insomnia, unspecified: Secondary | ICD-10-CM

## 2022-09-20 DIAGNOSIS — G4733 Obstructive sleep apnea (adult) (pediatric): Secondary | ICD-10-CM

## 2022-09-20 DIAGNOSIS — J329 Chronic sinusitis, unspecified: Secondary | ICD-10-CM | POA: Insufficient documentation

## 2022-09-20 NOTE — Assessment & Plan Note (Signed)
-   Former smoker. Uses Stiolto Respimat as needed  - No recent exacerbations. Intermittent wheezing symptoms.

## 2022-09-20 NOTE — Progress Notes (Signed)
Reviewed and agree with assessment/plan.   Coralyn Helling, MD [facility] Pulmonary/Critical Care 09/20/2022, 12:39 PM Pager:  [phone]

## 2022-09-20 NOTE — Assessment & Plan Note (Addendum)
-   Patient has symptoms of loud snoring, restless sleep and daytime sleepiness. He had an inconclusive sleep study 8-10 years ago. Epworth score 5/24. BMI 46. Concern patient has underlying sleep apnea, needs home sleep study to evaluate. Reviewed risks of untreated sleep apnea including cardiac arrhythmias, pulmonary hypertension, stoke and diabetes. We also discussed treatment options including weight loss, oral appliance, CPAP therapy or referral to ENT for possible surgical options. Encourage side sleeping position and weight loss. Advised against driving if experiencing excessive daytime sleepiness. FU 1-2 weeks after sleep study to review results and treatment options if needed.

## 2022-09-20 NOTE — Assessment & Plan Note (Signed)
-   It can take patient 1-3 hours to fall asleep. Getting sleep study. He has tried trazodone in the past without much relief and had residual daytime drowsiness. Recommend he try diphenhydramine 50mg  at bedtime.

## 2022-09-20 NOTE — Assessment & Plan Note (Signed)
-   Difficulty breathing from nasal passages d/t congestion at night  - Advised patient try using OTC fluticasone and/or saline nasal rinses at bedtime as needed

## 2022-10-23 ENCOUNTER — Ambulatory Visit (INDEPENDENT_AMBULATORY_CARE_PROVIDER_SITE_OTHER): Payer: BC Managed Care – PPO | Admitting: Adult Health

## 2022-10-23 DIAGNOSIS — G4733 Obstructive sleep apnea (adult) (pediatric): Secondary | ICD-10-CM | POA: Diagnosis not present

## 2022-10-23 DIAGNOSIS — R0683 Snoring: Secondary | ICD-10-CM

## 2022-10-29 NOTE — Progress Notes (Signed)
Please let patient know home sleep study showed mild obstructive sleep apnea, he had on average 12 apneic events an hour.  During his sleep consult it was noted that he had been trialed on a friend CPAP and reported benefit from use.  If he would like to be started on CPAP please place an order for auto CPAP 5 to 15 cm H2O with mask of choice re: mild OSA.  He will need a follow-up in 31 to 90 days for CPAP compliance.  If he would like to review sleep study in greater detail please set up virtual visit.

## 2023-01-31 HISTORY — PX: ELBOW SURGERY: SHX618

## 2023-02-14 DIAGNOSIS — S46219A Strain of muscle, fascia and tendon of other parts of biceps, unspecified arm, initial encounter: Secondary | ICD-10-CM | POA: Insufficient documentation

## 2023-05-15 ENCOUNTER — Emergency Department: Payer: BC Managed Care – PPO

## 2023-05-15 ENCOUNTER — Encounter: Payer: Self-pay | Admitting: Emergency Medicine

## 2023-05-15 ENCOUNTER — Emergency Department
Admission: EM | Admit: 2023-05-15 | Discharge: 2023-05-15 | Disposition: A | Discharge status: Home / Self Care | Payer: BC Managed Care – PPO | Attending: Emergency Medicine | Admitting: Emergency Medicine

## 2023-05-15 ENCOUNTER — Other Ambulatory Visit: Payer: Self-pay

## 2023-05-15 DIAGNOSIS — N201 Calculus of ureter: Secondary | ICD-10-CM | POA: Diagnosis not present

## 2023-05-15 DIAGNOSIS — J45909 Unspecified asthma, uncomplicated: Secondary | ICD-10-CM | POA: Insufficient documentation

## 2023-05-15 DIAGNOSIS — R109 Unspecified abdominal pain: Secondary | ICD-10-CM

## 2023-05-15 DIAGNOSIS — E039 Hypothyroidism, unspecified: Secondary | ICD-10-CM | POA: Diagnosis not present

## 2023-05-15 LAB — CBC
HCT: 50.1 % (ref 39.0–52.0)
Hemoglobin: 17.2 g/dL — ABNORMAL HIGH (ref 13.0–17.0)
MCH: 31.7 pg (ref 26.0–34.0)
MCHC: 34.3 g/dL (ref 30.0–36.0)
MCV: 92.3 fL (ref 80.0–100.0)
Platelets: 244 10*3/uL (ref 150–400)
RBC: 5.43 MIL/uL (ref 4.22–5.81)
RDW: 12.3 % (ref 11.5–15.5)
WBC: 7.2 10*3/uL (ref 4.0–10.5)
nRBC: 0 % (ref 0.0–0.2)

## 2023-05-15 LAB — URINALYSIS, ROUTINE W REFLEX MICROSCOPIC
Bacteria, UA: NONE SEEN
Bilirubin Urine: NEGATIVE
Glucose, UA: NEGATIVE mg/dL
Ketones, ur: NEGATIVE mg/dL
Leukocytes,Ua: NEGATIVE
Nitrite: NEGATIVE
Protein, ur: NEGATIVE mg/dL
Specific Gravity, Urine: 1.027 (ref 1.005–1.030)
Squamous Epithelial / HPF: 0 /[HPF] (ref 0–5)
pH: 6 (ref 5.0–8.0)

## 2023-05-15 LAB — COMPREHENSIVE METABOLIC PANEL
ALT: 31 U/L (ref 0–44)
AST: 26 U/L (ref 15–41)
Albumin: 4.3 g/dL (ref 3.5–5.0)
Alkaline Phosphatase: 43 U/L (ref 38–126)
Anion gap: 9 (ref 5–15)
BUN: 17 mg/dL (ref 6–20)
CO2: 23 mmol/L (ref 22–32)
Calcium: 9.1 mg/dL (ref 8.9–10.3)
Chloride: 103 mmol/L (ref 98–111)
Creatinine, Ser: 1.11 mg/dL (ref 0.61–1.24)
GFR, Estimated: >60 mL/min (ref >60)
Glucose, Bld: 104 mg/dL — ABNORMAL HIGH (ref 70–99)
Potassium: 4.1 mmol/L (ref 3.5–5.1)
Sodium: 135 mmol/L (ref 135–145)
Total Bilirubin: 0.8 mg/dL (ref <1.2)
Total Protein: 7.5 g/dL (ref 6.5–8.1)

## 2023-05-15 LAB — LIPASE, BLOOD: Lipase: 32 U/L (ref 11–51)

## 2023-05-15 MED ORDER — IOHEXOL 300 MG/ML  SOLN
100.0000 mL | Freq: Once | INTRAMUSCULAR | Status: AC | PRN
  Administered 2023-05-15: 100 mL via INTRAVENOUS

## 2023-05-15 MED ORDER — NAPROXEN 500 MG PO TABS: 500.0000 mg | ORAL_TABLET | Freq: Two times a day (BID) | ORAL | 0 refills | Status: AC

## 2023-05-15 MED ORDER — ONDANSETRON 4 MG PO TBDP: 4.0000 mg | ORAL_TABLET | Freq: Three times a day (TID) | ORAL | 0 refills | Status: DC | PRN

## 2023-05-15 NOTE — ED Triage Notes (Addendum)
Pt in with co RLQ since Tuesday, no radiation. Took oxycodone at home with some relief. Pt has hx of kidney stones but states does not feel the same.

## 2023-05-15 NOTE — ED Provider Notes (Signed)
[facility] Provider Note    Event Date/Time   First MD Initiated Contact with Patient 05/15/23 1008     (approximate)   History   Flank Pain   HPI  Willie Strong is a 42 y.o. male with a past medical history of morbid obesity, nephrolithiasis who presents today for evaluation of right sided flank pain.  Patient reports that this began 2 days ago.  He 1 episode of vomiting at the time of symptom onset, then no vomiting since.  He denies any back pain.  He denies any testicular pain or swelling.  No dysuria or hematuria.  He reports that he has had some loose stools over the last couple of days.  He reports that he has had kidney stones in the past, though this feels different.  He does not have a urologist.  Patient Active Problem List   Diagnosis Date Noted   Chronic sinusitis 09/20/2022   Insomnia 09/20/2022   Gastro-esophageal reflux 06/03/2018   Asthma 05/19/2013   Hypothyroid 05/19/2013   Status asthmaticus 03/23/2012   Morbid obesity (HCC) 03/23/2012   Loud snoring 03/23/2012   Back pain 03/23/2012          Physical Exam   Triage Vital Signs: ED Triage Vitals  Encounter Vitals Group     BP 05/15/23 0938 (!) 155/98     Systolic BP Percentile --      Diastolic BP Percentile --      Pulse Rate 05/15/23 0938 93     Resp 05/15/23 0938 18     Temp 05/15/23 0938 98.4 F (36.9 C)     Temp Source 05/15/23 0938 Oral     SpO2 05/15/23 0938 100 %     Weight 05/15/23 0939 (!) 350 lb (158.8 kg)     Height 05/15/23 0939 6\' 1"  (1.854 m)     Head Circumference --      Peak Flow --      Pain Score 05/15/23 0939 3     Pain Loc --      Pain Education --      Exclude from Growth Chart --     Most recent vital signs: Vitals:   05/15/23 0938 05/15/23 1148  BP: (!) 155/98 (!) 141/99  Pulse: 93 78  Resp: 18 16  Temp: 98.4 F (36.9 C)   SpO2: 100% 97%    Physical Exam Vitals and nursing note reviewed.  Constitutional:      General: Awake  and alert. No acute distress.    Appearance: Normal appearance. The patient is obese.  HENT:     Head: Normocephalic and atraumatic.     Mouth: Mucous membranes are moist.  Eyes:     General: PERRL. Normal EOMs        Right eye: No discharge.        Left eye: No discharge.     Conjunctiva/sclera: Conjunctivae normal.  Cardiovascular:     Rate and Rhythm: Normal rate and regular rhythm.     Pulses: Normal pulses.  Pulmonary:     Effort: Pulmonary effort is normal. No respiratory distress.     Breath sounds: Normal breath sounds.  Abdominal:     Abdomen is soft. There is no abdominal tenderness. No rebound or guarding. No distention.  No CVA tenderness. Musculoskeletal:        General: No swelling. Normal range of motion.     Cervical back: Normal range of motion and neck supple.  Skin:    General: Skin is warm and dry.     Capillary Refill: Capillary refill takes less than 2 seconds.     Findings: No rash.  Neurological:     Mental Status: The patient is awake and alert.      ED Results / Procedures / Treatments   Labs (all labs ordered are listed, but only abnormal results are displayed) Labs Reviewed  CBC - Abnormal; Notable for the following components:      Result Value   Hemoglobin 17.2 (*)    All other components within normal limits  COMPREHENSIVE METABOLIC PANEL - Abnormal; Notable for the following components:   Glucose, Bld 104 (*)    All other components within normal limits  URINALYSIS, ROUTINE W REFLEX MICROSCOPIC - Abnormal; Notable for the following components:   Color, Urine AMBER (*)    APPearance HAZY (*)    Hgb urine dipstick SMALL (*)    All other components within normal limits  LIPASE, BLOOD     EKG     RADIOLOGY I independently reviewed and interpreted imaging and agree with radiologists findings.     PROCEDURES:  Critical Care performed:   Procedures   MEDICATIONS ORDERED IN ED: Medications  iohexol (OMNIPAQUE) 300 MG/ML  solution 100 mL (100 mLs Intravenous Contrast Given 05/15/23 1058)     IMPRESSION / MDM / ASSESSMENT AND PLAN / ED COURSE  I reviewed the triage vital signs and the nursing notes.   Differential diagnosis includes, but is not limited to, nephrolithiasis, ureteral colic, appendicitis, gastroenteritis.  Patient is awake and alert, hemodynamically stable and afebrile.  She has no reproducible abdominal tenderness on exam.  Labs obtained in triage are overall reassuring.  No leukocytosis, urinalysis reveals mild RBCs, no leukocytes or nitrites or bacteria.  CT scan with contrast was obtained given that patient reported that this felt very different from his previous kidney stones.  However, CT does reveal a 5 mm mid ureteral stone.  The stone does not appear to be infected at this time without leukocytosis, no bacteria, leukocytes, or nitrates in the urine.  Patient reports that his pain is controlled, he declines any analgesics or antiemetics in the emergency department.  He was given prescriptions for naproxen and Zofran to use at home if needed.  He was also given the information for urology.  We discussed return precautions to the emergency department, as well as the importance of outpatient follow-up with urology.  Patient or stands and agrees with plan.  He was discharged in stable condition with his significant other.   Patient's presentation is most consistent with acute complicated illness / injury requiring diagnostic workup.      FINAL CLINICAL IMPRESSION(S) / ED DIAGNOSES   Final diagnoses:  Right flank pain  Ureterolithiasis     Rx / DC Orders   ED Discharge Orders          Ordered    naproxen (NAPROSYN) 500 MG tablet  2 times daily with meals        05/15/23 1136    ondansetron (ZOFRAN-ODT) 4 MG disintegrating tablet  Every 8 hours PRN        05/15/23 1136             Note:  This document was prepared using Dragon voice recognition software and may include  unintentional dictation errors.   Jackelyn Hoehn, PA-C 05/15/23 1452    Janith Lima, MD 05/15/23 [phone]

## 2023-05-15 NOTE — Discharge Instructions (Signed)
You have a 5 mm kidney stone.  You may take the medications as needed for pain and nausea as prescribed.  Please follow-up with urology.  Please return for any new, worsening, or change in symptoms or other concerns.  It was a pleasure caring for you today.

## 2023-05-15 NOTE — ED Notes (Signed)
Bp elevated.  Advised to recheck in the next week to see if it is staying up or just transient and f/u withe dr.

## 2023-06-24 ENCOUNTER — Encounter: Payer: Self-pay | Admitting: Family Medicine

## 2023-06-24 ENCOUNTER — Ambulatory Visit: Payer: 59 | Admitting: Family Medicine

## 2023-06-24 VITALS — BP 128/74 | HR 100 | Ht 73.0 in | Wt 367.0 lb

## 2023-06-24 DIAGNOSIS — F3341 Major depressive disorder, recurrent, in partial remission: Secondary | ICD-10-CM

## 2023-06-24 DIAGNOSIS — E039 Hypothyroidism, unspecified: Secondary | ICD-10-CM

## 2023-06-24 DIAGNOSIS — N529 Male erectile dysfunction, unspecified: Secondary | ICD-10-CM | POA: Diagnosis not present

## 2023-06-24 DIAGNOSIS — J449 Chronic obstructive pulmonary disease, unspecified: Secondary | ICD-10-CM

## 2023-06-24 MED ORDER — FLUOXETINE HCL 20 MG PO CAPS: 20.0000 mg | ORAL_CAPSULE | Freq: Every morning | ORAL | 1 refills | Status: DC

## 2023-06-24 MED ORDER — LEVOTHYROXINE SODIUM 50 MCG PO TABS: 50.0000 ug | ORAL_TABLET | Freq: Every day | ORAL | 1 refills | Status: DC

## 2023-06-24 MED ORDER — STIOLTO RESPIMAT 2.5-2.5 MCG/ACT IN AERS: 2.0000 | INHALATION_SPRAY | RESPIRATORY_TRACT | 2 refills | Status: DC | PRN

## 2023-06-24 MED ORDER — SILDENAFIL CITRATE 100 MG PO TABS: 100.0000 mg | ORAL_TABLET | Freq: Every day | ORAL | 11 refills | Status: DC | PRN

## 2023-06-24 NOTE — Progress Notes (Signed)
 Date:  06/24/2023   Name:  Willie Strong   DOB:  [birth year]   MRN:  [id number]   Chief Complaint: Depression (Refill on Prozac .), Erectile Dysfunction (Refill Viagra  100 PRN), and COPD (Refill Stiolto. )  Depression        This is a chronic problem.  The current episode started more than 1 year ago.   The onset quality is gradual.   The problem occurs intermittently.  The problem has been gradually improving since onset.  Associated symptoms include decreased concentration, hopelessness, insomnia, irritable, restlessness, decreased interest, appetite change and sad.  Associated symptoms include no fatigue, no helplessness, no body aches, no myalgias, no headaches, no indigestion and no suicidal ideas.  Past treatments include SSRIs - Selective serotonin reuptake inhibitors.  Compliance with treatment is good.  Previous treatment provided mild relief.  Past medical history includes thyroid problem.   Erectile Dysfunction This is a chronic problem. The current episode started more than 1 year ago. The problem has been gradually improving since onset. The nature of his difficulty is achieving erection and maintaining erection. Non-physiologic factors contributing to erectile dysfunction are anxiety. Past treatments include sildenafil . The treatment provided moderate relief.  COPD He complains of wheezing. There is no chest tightness, difficulty breathing, frequent throat clearing, hemoptysis or shortness of breath. The current episode started 1 to 4 weeks ago. The problem occurs intermittently. The problem has been gradually improving. Associated symptoms include appetite change. Pertinent negatives include no chest pain, dyspnea on exertion, fever, headaches, heartburn, myalgias, nasal congestion, PND, postnasal drip, rhinorrhea, sneezing, sore throat or weight loss. His past medical history is significant for COPD.  Thyroid Problem Presents for follow-up visit. Symptoms include anxiety and depressed  mood. Patient reports no cold intolerance, constipation, diaphoresis, diarrhea, dry skin, fatigue, hair loss, nail problem, palpitations, tremors, visual change, weight gain or weight loss. The symptoms have been stable.    Lab Results  Component Value Date   NA 135 05/15/2023   K 4.1 05/15/2023   CO2 23 05/15/2023   GLUCOSE 104 (H) 05/15/2023   BUN 17 05/15/2023   CREATININE 1.11 05/15/2023   CALCIUM 9.1 05/15/2023   GFRNONAA >60 05/15/2023   No results found for: CHOL, HDL, LDLCALC, LDLDIRECT, TRIG, CHOLHDL Lab Results  Component Value Date   TSH 1.144 03/23/2012   No results found for: HGBA1C Lab Results  Component Value Date   WBC 7.2 05/15/2023   HGB 17.2 (H) 05/15/2023   HCT 50.1 05/15/2023   MCV 92.3 05/15/2023   PLT 244 05/15/2023   Lab Results  Component Value Date   ALT 31 05/15/2023   AST 26 05/15/2023   ALKPHOS 43 05/15/2023   BILITOT 0.8 05/15/2023   No results found for: 25OHVITD2, 25OHVITD3, VD25OH   Review of Systems  Constitutional:  Positive for appetite change. Negative for diaphoresis, fatigue, fever, weight gain and weight loss.  HENT:  Negative for facial swelling, nosebleeds, postnasal drip, rhinorrhea, sinus pressure, sinus pain, sneezing and sore throat.   Eyes:  Negative for photophobia.  Respiratory:  Positive for wheezing. Negative for hemoptysis and shortness of breath.   Cardiovascular:  Negative for chest pain, dyspnea on exertion, palpitations, leg swelling and PND.  Gastrointestinal:  Negative for abdominal distention, abdominal pain, constipation, diarrhea and heartburn.  Endocrine: Negative for cold intolerance.  Musculoskeletal:  Negative for myalgias.  Neurological:  Negative for tremors and headaches.  Psychiatric/Behavioral:  Positive for decreased concentration and depression. Negative for suicidal ideas.  The patient is nervous/anxious and has insomnia.     Patient Active Problem List   Diagnosis Date Noted    Chronic sinusitis 09/20/2022   Insomnia 09/20/2022   Gastro-esophageal reflux 06/03/2018   Asthma 05/19/2013   Hypothyroid 05/19/2013   Status asthmaticus 03/23/2012   Morbid obesity (HCC) 03/23/2012   Loud snoring 03/23/2012   Back pain 03/23/2012    No Known Allergies  Past Surgical History:  Procedure Laterality Date   CHOLECYSTECTOMY     COLONOSCOPY WITH PROPOFOL  N/A 07/24/2021   Procedure: COLONOSCOPY WITH PROPOFOL ;  Surgeon: Maryruth Ole DASEN, MD;  Location: [facility] ENDOSCOPY;  Service: Endoscopy;  Laterality: N/A;   ELBOW SURGERY  01/31/2023   gastic sleeve  2016   TONSILLECTOMY      Social History   Tobacco Use   Smoking status: Former    Current packs/day: 0.25    Average packs/day: 0.3 packs/day for 10.0 years (2.5 ttl pk-yrs)    Types: Cigarettes   Smokeless tobacco: Current    Types: Snuff  Vaping Use   Vaping status: Never Used  Substance Use Topics   Alcohol use: No    Comment: rare   Drug use: No     Medication list has been reviewed and updated.  Current Meds  Medication Sig   acetaminophen  (TYLENOL ) 500 MG tablet Take 500 mg by mouth as needed.   cyclobenzaprine  (FLEXERIL ) 10 MG tablet Take 1 tablet (10 mg total) by mouth 2 (two) times daily as needed for muscle spasms.   diphenhydrAMINE  (BENADRYL ) 50 MG capsule Take 50 mg by mouth every 6 (six) hours as needed.   FLUoxetine  (PROZAC ) 20 MG capsule Take 20 mg by mouth every morning.   ibuprofen (ADVIL) 400 MG tablet Take 800 mg by mouth every 8 (eight) hours as needed for moderate pain.   levothyroxine  (SYNTHROID ) 50 MCG tablet Take 50 mcg by mouth daily before breakfast.   Melatonin 10 MG CAPS Take by mouth.   meloxicam (MOBIC) 7.5 MG tablet Take 7.5 mg by mouth daily.   omeprazole (PRILOSEC) 20 MG capsule Take 20 mg by mouth daily.   oxycodone  (OXY-IR) 5 MG capsule Take 5 mg by mouth every 4 (four) hours as needed.   sildenafil  (VIAGRA ) 100 MG tablet Take 100 mg by mouth daily as needed for  erectile dysfunction.   Tiotropium Bromide-Olodaterol (STIOLTO RESPIMAT  IN) Inhale 1 puff into the lungs as needed.   [DISCONTINUED] ondansetron  (ZOFRAN -ODT) 4 MG disintegrating tablet Take 1 tablet (4 mg total) by mouth every 8 (eight) hours as needed.       06/24/2023    2:57 PM  GAD 7 : Generalized Anxiety Score  Nervous, Anxious, on Edge 3  Control/stop worrying 1  Worry too much - different things 3  Trouble relaxing 1  Restless 3  Easily annoyed or irritable 3  Afraid - awful might happen 1  Total GAD 7 Score 15  Anxiety Difficulty Very difficult       06/24/2023    2:57 PM  Depression screen PHQ 2/9  Decreased Interest 3  Down, Depressed, Hopeless 1  PHQ - 2 Score 4  Altered sleeping 3  Tired, decreased energy 2  Change in appetite 2  Feeling bad or failure about yourself  2  Trouble concentrating 2  Moving slowly or fidgety/restless 3  Suicidal thoughts 0  PHQ-9 Score 18  Difficult doing work/chores Very difficult    BP Readings from Last 3 Encounters:  06/24/23 128/74  05/15/23 ROLLEN)  141/99  09/20/22 124/86    Physical Exam Vitals and nursing note reviewed.  Constitutional:      General: He is irritable.     Appearance: He is well-developed.  HENT:     Head: Normocephalic and atraumatic.     Right Ear: Tympanic membrane, ear canal and external ear normal.     Left Ear: Tympanic membrane, ear canal and external ear normal.     Nose: Nose normal.     Mouth/Throat:     Mouth: Mucous membranes are moist.     Dentition: Normal dentition.  Eyes:     General: Lids are normal. No scleral icterus.    Conjunctiva/sclera: Conjunctivae normal.     Pupils: Pupils are equal, round, and reactive to light.  Neck:     Thyroid: No thyromegaly.     Vascular: No carotid bruit, hepatojugular reflux or JVD.     Trachea: No tracheal deviation.  Cardiovascular:     Rate and Rhythm: Normal rate and regular rhythm.     Pulses: Normal pulses.     Heart sounds: Normal heart  sounds. No murmur heard.    No friction rub. No gallop.  Pulmonary:     Effort: Pulmonary effort is normal.     Breath sounds: Normal breath sounds. No wheezing, rhonchi or rales.  Abdominal:     General: Bowel sounds are normal.     Palpations: Abdomen is soft. There is no hepatomegaly, splenomegaly or mass.     Tenderness: There is no abdominal tenderness. There is no guarding or rebound.     Hernia: There is no hernia in the left inguinal area.  Musculoskeletal:        General: Normal range of motion.     Cervical back: Normal range of motion and neck supple.     Right lower leg: No edema.     Left lower leg: No edema.  Lymphadenopathy:     Cervical: No cervical adenopathy.  Skin:    General: Skin is warm and dry.     Findings: No bruising, erythema or rash.  Neurological:     Mental Status: He is alert and oriented to person, place, and time.     Cranial Nerves: No cranial nerve deficit.     Sensory: No sensory deficit.     Deep Tendon Reflexes: Reflexes are normal and symmetric.  Psychiatric:        Mood and Affect: Mood is not anxious or depressed.     Wt Readings from Last 3 Encounters:  06/24/23 (!) 367 lb (166.5 kg)  05/15/23 (!) 350 lb (158.8 kg)  09/20/22 (!) 352 lb 3.2 oz (159.8 kg)    BP 128/74   Pulse 100   Ht 6' 1 (1.854 m)   Wt (!) 367 lb (166.5 kg)   SpO2 98%   BMI 48.42 kg/m   Assessment and Plan: 1. Recurrent major depressive disorder, in partial remission (HCC) Chronic.  Controlled.  Stable.  PHQ is 18 GAD score is 15 we will continue fluoxetine  20 mg once a day at current dosing patient has been taking some for trying to skip on weekends and taking it daily during the week.  We will recheck in 6 weeks to see what the full effects of having regular dosing has with patient. - FLUoxetine  (PROZAC ) 20 MG capsule; Take 1 capsule (20 mg total) by mouth every morning.  Dispense: 90 capsule; Refill: 1  2. Erectile dysfunction, unspecified erectile  dysfunction type .  Controlled.  Stable.  Patient would like refills on his sildenafil  and this will be done and refills have been provided. - sildenafil  (VIAGRA ) 100 MG tablet; Take 1 tablet (100 mg total) by mouth daily as needed for erectile dysfunction.  Dispense: 10 tablet; Refill: 11  3. Chronic obstructive pulmonary disease, unspecified COPD type (HCC) Chronic.  Controlled.  Stable.  Patient has had some mild shortness of breath her main breakthrough but he has been trying to make his Stiolto last for extended period of times beyond the usual.  We will refill and will recheck breathing at 6 weeks. - Tiotropium Bromide-Olodaterol (STIOLTO RESPIMAT ) 2.5-2.5 MCG/ACT AERS; Inhale 2 puffs into the lungs as needed.  Dispense: 4 g; Refill: 2  4. Acquired hypothyroidism (Primary) Chronic.  Controlled.  Stable.  Uncertain where his final dose will be yet 1 time was on 125 mcg Dr. Ziegler had him down to 50 mcg we will resume at 50 mcg where he was last dosing and will check TSH at that time and adjust accordingly. - levothyroxine  (SYNTHROID ) 50 MCG tablet; Take 1 tablet (50 mcg total) by mouth daily before breakfast.  Dispense: 90 tablet; Refill: 1     Cathryne Molt, MD

## 2023-08-07 ENCOUNTER — Ambulatory Visit: Admitting: Family Medicine

## 2023-08-07 ENCOUNTER — Encounter: Payer: Self-pay | Admitting: Family Medicine

## 2023-08-07 VITALS — BP 120/86 | HR 93 | Ht 73.0 in | Wt 367.0 lb

## 2023-08-07 DIAGNOSIS — F3341 Major depressive disorder, recurrent, in partial remission: Secondary | ICD-10-CM | POA: Diagnosis not present

## 2023-08-07 MED ORDER — FLUOXETINE HCL 20 MG PO CAPS: 20.0000 mg | ORAL_CAPSULE | Freq: Every morning | ORAL | 1 refills | Status: DC

## 2023-08-07 MED ORDER — FLUOXETINE HCL 10 MG PO CAPS: 10.0000 mg | ORAL_CAPSULE | Freq: Every day | ORAL | 3 refills | Status: DC

## 2023-08-07 NOTE — Progress Notes (Signed)
 Date:  08/07/2023   Name:  Willie Strong   DOB:  [birth year]   MRN:  [id number]   Chief Complaint: Depression  Depression        This is a chronic problem.  The current episode started more than 1 year ago.   The onset quality is gradual.   The problem has been waxing and waning since onset.  Associated symptoms include fatigue, insomnia, irritable and restlessness.  Associated symptoms include no decreased concentration, no helplessness, no hopelessness, no decreased interest, no appetite change, no body aches, not sad and no suicidal ideas.  Past treatments include SSRIs - Selective serotonin reuptake inhibitors.   Lab Results  Component Value Date   NA 135 05/15/2023   K 4.1 05/15/2023   CO2 23 05/15/2023   GLUCOSE 104 (H) 05/15/2023   BUN 17 05/15/2023   CREATININE 1.11 05/15/2023   CALCIUM 9.1 05/15/2023   GFRNONAA >60 05/15/2023   No results found for: "CHOL", "HDL", "LDLCALC", "LDLDIRECT", "TRIG", "CHOLHDL" Lab Results  Component Value Date   TSH 1.144 03/23/2012   No results found for: "HGBA1C" Lab Results  Component Value Date   WBC 7.2 05/15/2023   HGB 17.2 (H) 05/15/2023   HCT 50.1 05/15/2023   MCV 92.3 05/15/2023   PLT 244 05/15/2023   Lab Results  Component Value Date   ALT 31 05/15/2023   AST 26 05/15/2023   ALKPHOS 43 05/15/2023   BILITOT 0.8 05/15/2023   No results found for: "25OHVITD2", "25OHVITD3", "VD25OH"   Review of Systems  Constitutional:  Positive for fatigue. Negative for appetite change.  Respiratory:  Negative for chest tightness, shortness of breath and wheezing.   Cardiovascular:  Negative for chest pain, palpitations and leg swelling.  Gastrointestinal:  Negative for abdominal pain.  Psychiatric/Behavioral:  Positive for depression. Negative for decreased concentration and suicidal ideas. The patient has insomnia.     Patient Active Problem List   Diagnosis Date Noted   Chronic sinusitis 09/20/2022   Insomnia 09/20/2022    Gastro-esophageal reflux 06/03/2018   Asthma 05/19/2013   Hypothyroid 05/19/2013   Status asthmaticus 03/23/2012   Morbid obesity (HCC) 03/23/2012   Loud snoring 03/23/2012   Back pain 03/23/2012    No Known Allergies  Past Surgical History:  Procedure Laterality Date   CHOLECYSTECTOMY     COLONOSCOPY WITH PROPOFOL N/A 07/24/2021   Procedure: COLONOSCOPY WITH PROPOFOL;  Surgeon: Regis Bill, MD;  Location: [facility] ENDOSCOPY;  Service: Endoscopy;  Laterality: N/A;   ELBOW SURGERY  01/31/2023   gastic sleeve  2016   TONSILLECTOMY      Social History   Tobacco Use   Smoking status: Former    Current packs/day: 0.25    Average packs/day: 0.3 packs/day for 10.0 years (2.5 ttl pk-yrs)    Types: Cigarettes   Smokeless tobacco: Current    Types: Snuff  Vaping Use   Vaping status: Never Used  Substance Use Topics   Alcohol use: No    Comment: rare   Drug use: No     Medication list has been reviewed and updated.  Current Meds  Medication Sig   acetaminophen (TYLENOL) 500 MG tablet Take 500 mg by mouth as needed.   cyclobenzaprine (FLEXERIL) 10 MG tablet Take 1 tablet (10 mg total) by mouth 2 (two) times daily as needed for muscle spasms.   diphenhydrAMINE (BENADRYL) 50 MG capsule Take 50 mg by mouth every 6 (six) hours as needed.   FLUoxetine (PROZAC)  20 MG capsule Take 1 capsule (20 mg total) by mouth every morning.   ibuprofen (ADVIL) 400 MG tablet Take 800 mg by mouth every 8 (eight) hours as needed for moderate pain.   levothyroxine (SYNTHROID) 50 MCG tablet Take 1 tablet (50 mcg total) by mouth daily before breakfast.   Melatonin 10 MG CAPS Take by mouth.   meloxicam (MOBIC) 7.5 MG tablet Take 7.5 mg by mouth daily.   omeprazole (PRILOSEC) 20 MG capsule Take 20 mg by mouth daily.   oxycodone (OXY-IR) 5 MG capsule Take 5 mg by mouth every 4 (four) hours as needed.   sildenafil (VIAGRA) 100 MG tablet Take 1 tablet (100 mg total) by mouth daily as needed for  erectile dysfunction.   Tiotropium Bromide-Olodaterol (STIOLTO RESPIMAT) 2.5-2.5 MCG/ACT AERS Inhale 2 puffs into the lungs as needed.       08/07/2023    3:54 PM 06/24/2023    2:57 PM  GAD 7 : Generalized Anxiety Score  Nervous, Anxious, on Edge 3 3  Control/stop worrying 2 1  Worry too much - different things 3 3  Trouble relaxing 3 1  Restless 3 3  Easily annoyed or irritable 3 3  Afraid - awful might happen 2 1  Total GAD 7 Score 19 15  Anxiety Difficulty Somewhat difficult Very difficult       08/07/2023    3:54 PM 06/24/2023    2:57 PM  Depression screen PHQ 2/9  Decreased Interest 3 3  Down, Depressed, Hopeless 2 1  PHQ - 2 Score 5 4  Altered sleeping 3 3  Tired, decreased energy 3 2  Change in appetite 3 2  Feeling bad or failure about yourself  2 2  Trouble concentrating 2 2  Moving slowly or fidgety/restless 3 3  Suicidal thoughts 0 0  PHQ-9 Score 21 18  Difficult doing work/chores Somewhat difficult Very difficult    BP Readings from Last 3 Encounters:  08/07/23 120/86  06/24/23 128/74  05/15/23 (!) 141/99    Physical Exam Vitals and nursing note reviewed.  Constitutional:      General: He is irritable.     Appearance: He is well-developed.  HENT:     Head: Normocephalic and atraumatic.     Right Ear: Tympanic membrane, ear canal and external ear normal.     Left Ear: Tympanic membrane, ear canal and external ear normal.     Mouth/Throat:     Dentition: Normal dentition.  Eyes:     General: Lids are normal. No scleral icterus. Neck:     Thyroid: No thyromegaly.     Vascular: No hepatojugular reflux or JVD.     Trachea: No tracheal deviation.  Cardiovascular:     Rate and Rhythm: Normal rate and regular rhythm.     Heart sounds: Normal heart sounds. No murmur heard.    No gallop.  Pulmonary:     Effort: Pulmonary effort is normal.     Breath sounds: Normal breath sounds. No wheezing, rhonchi or rales.  Abdominal:     Palpations: Abdomen is  soft. There is no hepatomegaly or splenomegaly.     Tenderness: There is no abdominal tenderness.     Hernia: There is no hernia in the left inguinal area.  Musculoskeletal:        General: Normal range of motion.     Cervical back: Neck supple.  Skin:    General: Skin is warm and dry.     Findings: No rash.  Neurological:     Mental Status: He is alert and oriented to person, place, and time.     Sensory: No sensory deficit.     Deep Tendon Reflexes: Reflexes are normal and symmetric.  Psychiatric:        Mood and Affect: Mood normal. Mood is not anxious or depressed.        Behavior: Behavior normal.        Thought Content: Thought content normal.        Judgment: Judgment normal.     Wt Readings from Last 3 Encounters:  08/07/23 (!) 367 lb (166.5 kg)  06/24/23 (!) 367 lb (166.5 kg)  05/15/23 (!) 350 lb (158.8 kg)    BP 120/86   Pulse 93   Ht 6\' 1"  (1.854 m)   Wt (!) 367 lb (166.5 kg)   SpO2 96%   BMI 48.42 kg/m   Assessment and Plan: 1. Recurrent major depressive disorder, in partial remission [facility]) Patient returns for reevaluation depression which we initiated Prozac 20 mg every morning.  With PHQ of 21 and a GAD score.  I have offered referral for psychiatry and patient refuses and would prefer to increase dosage of fluoxetine instead.  Patient denies any development of self-harm or worsening of symptoms and he is to return to clinic on a more immediate basis.  Should the symptoms worsen or develop.  Patient will return in 6 weeks when full effects of the new dosage of fluoxetine should be manifested to his fullest extent. - FLUoxetine (PROZAC) 20 MG capsule; Take 1 capsule (20 mg total) by mouth every morning.  Dispense: 90 capsule; Refill: 1     Elizabeth Sauer, MD

## 2023-08-08 ENCOUNTER — Ambulatory Visit: Payer: 59 | Admitting: Family Medicine

## 2023-09-18 ENCOUNTER — Ambulatory Visit: Admitting: Family Medicine

## 2023-09-18 ENCOUNTER — Encounter: Payer: Self-pay | Admitting: Family Medicine

## 2023-09-18 VITALS — BP 130/88 | HR 112 | Ht 73.0 in | Wt 360.2 lb

## 2023-09-18 DIAGNOSIS — I10 Essential (primary) hypertension: Secondary | ICD-10-CM

## 2023-09-18 DIAGNOSIS — F3341 Major depressive disorder, recurrent, in partial remission: Secondary | ICD-10-CM

## 2023-09-18 DIAGNOSIS — N529 Male erectile dysfunction, unspecified: Secondary | ICD-10-CM

## 2023-09-18 DIAGNOSIS — E039 Hypothyroidism, unspecified: Secondary | ICD-10-CM

## 2023-09-18 MED ORDER — FLUOXETINE HCL 20 MG PO CAPS: 20.0000 mg | ORAL_CAPSULE | Freq: Every morning | ORAL | 1 refills | Status: DC

## 2023-09-18 MED ORDER — SILDENAFIL CITRATE 100 MG PO TABS: 100.0000 mg | ORAL_TABLET | Freq: Every day | ORAL | 11 refills | Status: AC | PRN

## 2023-09-18 MED ORDER — LISINOPRIL 10 MG PO TABS: 10.0000 mg | ORAL_TABLET | Freq: Every day | ORAL | 1 refills | Status: DC

## 2023-09-18 MED ORDER — LISINOPRIL 10 MG PO TABS: 10.0000 mg | ORAL_TABLET | Freq: Every day | ORAL | 0 refills | Status: DC

## 2023-09-18 MED ORDER — FLUOXETINE HCL 10 MG PO CAPS: 10.0000 mg | ORAL_CAPSULE | Freq: Every day | ORAL | 1 refills | Status: DC

## 2023-09-18 NOTE — Progress Notes (Signed)
 Date:  09/18/2023   Name:  Willie Strong   DOB:  1980/07/22   MRN:  [id number]   Chief Complaint: Depression  Depression        This is a chronic problem.  The current episode started more than 1 year ago.   The onset quality is gradual.   The problem occurs intermittently.  The problem has been gradually improving since onset.  Associated symptoms include decreased concentration, fatigue, insomnia, irritable, restlessness, decreased interest and sad.  Associated symptoms include no helplessness, no hopelessness and no suicidal ideas.  Past treatments include SSRIs - Selective serotonin reuptake inhibitors.  Previous treatment provided moderate relief.  Past medical history includes thyroid problem and anxiety.   Anxiety Presents for follow-up visit. Symptoms include decreased concentration, depressed mood, excessive worry, insomnia, irritability, nervous/anxious behavior and restlessness. Patient reports no palpitations, panic, shortness of breath or suicidal ideas. The severity of symptoms is causing significant distress.    Thyroid Problem Presents for follow-up visit. Symptoms include anxiety, depressed mood and fatigue. Patient reports no dry skin, hair loss, palpitations, weight gain or weight loss. The symptoms have been stable.    Lab Results  Component Value Date   NA 135 05/15/2023   K 4.1 05/15/2023   CO2 23 05/15/2023   GLUCOSE 104 (H) 05/15/2023   BUN 17 05/15/2023   CREATININE 1.11 05/15/2023   CALCIUM 9.1 05/15/2023   GFRNONAA >60 05/15/2023   No results found for: "CHOL", "HDL", "LDLCALC", "LDLDIRECT", "TRIG", "CHOLHDL" Lab Results  Component Value Date   TSH 1.144 03/23/2012   No results found for: "HGBA1C" Lab Results  Component Value Date   WBC 7.2 05/15/2023   HGB 17.2 (H) 05/15/2023   HCT 50.1 05/15/2023   MCV 92.3 05/15/2023   PLT 244 05/15/2023   Lab Results  Component Value Date   ALT 31 05/15/2023   AST 26 05/15/2023   ALKPHOS 43 05/15/2023    BILITOT 0.8 05/15/2023   No results found for: "25OHVITD2", "25OHVITD3", "VD25OH"   Review of Systems  Constitutional:  Positive for fatigue and irritability. Negative for fever, weight gain and weight loss.  Respiratory:  Negative for chest tightness, shortness of breath and wheezing.   Cardiovascular:  Negative for palpitations.  Gastrointestinal:  Negative for abdominal distention and abdominal pain.  Endocrine: Negative for polydipsia and polyuria.  Genitourinary:  Negative for difficulty urinating.  Musculoskeletal:  Negative for arthralgias.  Psychiatric/Behavioral:  Positive for decreased concentration and depression. Negative for suicidal ideas. The patient is nervous/anxious and has insomnia.     Patient Active Problem List   Diagnosis Date Noted  . Chronic sinusitis 09/20/2022  . Insomnia 09/20/2022  . Gastro-esophageal reflux 06/03/2018  . Asthma 05/19/2013  . Hypothyroid 05/19/2013  . Status asthmaticus 03/23/2012  . Morbid obesity (HCC) 03/23/2012  . Loud snoring 03/23/2012  . Back pain 03/23/2012    No Known Allergies  Past Surgical History:  Procedure Laterality Date  . CHOLECYSTECTOMY    . COLONOSCOPY WITH PROPOFOL  N/A 07/24/2021   Procedure: COLONOSCOPY WITH PROPOFOL ;  Surgeon: Shane Darling, MD;  Location: [facility] ENDOSCOPY;  Service: Endoscopy;  Laterality: N/A;  . ELBOW SURGERY  01/31/2023  . gastic sleeve  2016  . TONSILLECTOMY      Social History   Tobacco Use  . Smoking status: Former    Current packs/day: 0.25    Average packs/day: 0.3 packs/day for 10.0 years (2.5 ttl pk-yrs)    Types: Cigarettes  . Smokeless  tobacco: Current    Types: Snuff  Vaping Use  . Vaping status: Never Used  Substance Use Topics  . Alcohol use: No    Comment: rare  . Drug use: No     Medication list has been reviewed and updated.  Current Meds  Medication Sig  . acetaminophen  (TYLENOL ) 500 MG tablet Take 500 mg by mouth as needed.  . cyclobenzaprine   (FLEXERIL ) 10 MG tablet Take 1 tablet (10 mg total) by mouth 2 (two) times daily as needed for muscle spasms.  . diphenhydrAMINE  (BENADRYL ) 50 MG capsule Take 50 mg by mouth every 6 (six) hours as needed.  . FLUoxetine  (PROZAC ) 10 MG capsule Take 1 capsule (10 mg total) by mouth daily.  . FLUoxetine  (PROZAC ) 20 MG capsule Take 1 capsule (20 mg total) by mouth every morning.  Aaron Aas ibuprofen (ADVIL) 400 MG tablet Take 800 mg by mouth every 8 (eight) hours as needed for moderate pain.  . levothyroxine  (SYNTHROID ) 50 MCG tablet Take 1 tablet (50 mcg total) by mouth daily before breakfast.  . Melatonin 10 MG CAPS Take by mouth.  . meloxicam (MOBIC) 7.5 MG tablet Take 7.5 mg by mouth daily.  Aaron Aas omeprazole (PRILOSEC) 20 MG capsule Take 20 mg by mouth daily.  . oxycodone  (OXY-IR) 5 MG capsule Take 5 mg by mouth every 4 (four) hours as needed.  . sildenafil  (VIAGRA ) 100 MG tablet Take 1 tablet (100 mg total) by mouth daily as needed for erectile dysfunction.  . Tiotropium Bromide-Olodaterol (STIOLTO RESPIMAT ) 2.5-2.5 MCG/ACT AERS Inhale 2 puffs into the lungs as needed.       09/18/2023    3:45 PM 08/07/2023    3:54 PM 06/24/2023    2:57 PM  GAD 7 : Generalized Anxiety Score  Nervous, Anxious, on Edge 3 3 3   Control/stop worrying 2 2 1   Worry too much - different things 2 3 3   Trouble relaxing 2 3 1   Restless 2 3 3   Easily annoyed or irritable 3 3 3   Afraid - awful might happen 1 2 1   Total GAD 7 Score 15 19 15   Anxiety Difficulty Somewhat difficult Somewhat difficult Very difficult       09/18/2023    3:45 PM 08/07/2023    3:54 PM 06/24/2023    2:57 PM  Depression screen PHQ 2/9  Decreased Interest 3 3 3   Down, Depressed, Hopeless 2 2 1   PHQ - 2 Score 5 5 4   Altered sleeping 3 3 3   Tired, decreased energy 2 3 2   Change in appetite 1 3 2   Feeling bad or failure about yourself  2 2 2   Trouble concentrating 1 2 2   Moving slowly or fidgety/restless 2 3 3   Suicidal thoughts 0 0 0  PHQ-9 Score 16  21 18   Difficult doing work/chores Somewhat difficult Somewhat difficult Very difficult    BP Readings from Last 3 Encounters:  09/18/23 130/88  08/07/23 120/86  06/24/23 128/74    Physical Exam Vitals and nursing note reviewed.  Constitutional:      General: He is irritable.     Appearance: He is well-developed.  HENT:     Head: Normocephalic and atraumatic.     Right Ear: Tympanic membrane, ear canal and external ear normal.     Left Ear: Tympanic membrane, ear canal and external ear normal.     Nose: Nose normal.     Mouth/Throat:     Mouth: Mucous membranes are moist.  Dentition: Normal dentition.  Eyes:     General: Lids are normal. No scleral icterus.    Conjunctiva/sclera: Conjunctivae normal.     Pupils: Pupils are equal, round, and reactive to light.  Neck:     Thyroid: No thyromegaly.     Vascular: No carotid bruit, hepatojugular reflux or JVD.     Trachea: No tracheal deviation.  Cardiovascular:     Rate and Rhythm: Normal rate and regular rhythm.     Heart sounds: Normal heart sounds.  Pulmonary:     Effort: Pulmonary effort is normal.     Breath sounds: Normal breath sounds. No wheezing, rhonchi or rales.  Abdominal:     General: Bowel sounds are normal.     Palpations: Abdomen is soft. There is no hepatomegaly, splenomegaly or mass.     Tenderness: There is no abdominal tenderness. There is no rebound.     Hernia: There is no hernia in the left inguinal area.  Musculoskeletal:        General: Normal range of motion.     Cervical back: Normal range of motion and neck supple.  Lymphadenopathy:     Cervical: No cervical adenopathy.  Skin:    General: Skin is warm and dry.     Findings: No erythema or rash.  Neurological:     Mental Status: He is alert and oriented to person, place, and time.     Sensory: No sensory deficit.     Deep Tendon Reflexes: Reflexes are normal and symmetric.  Psychiatric:        Mood and Affect: Mood is not anxious or  depressed.    Wt Readings from Last 3 Encounters:  09/18/23 (!) 360 lb 4 oz (163.4 kg)  08/07/23 (!) 367 lb (166.5 kg)  06/24/23 (!) 367 lb (166.5 kg)    BP 130/88   Pulse (!) 112   Ht 6\' 1"  (1.854 m)   Wt (!) 360 lb 4 oz (163.4 kg)   SpO2 97%   BMI 47.53 kg/m   Assessment and Plan:  1. Recurrent major depressive disorder, in partial remission (HCC) Chronic.  Uncontrolled.  But patient relates that he has had a hectic day but he seems in very good spirits.  Would like continuing at the current dosing without change and we will continue Prozac  30 mg once a day.  Will recheck patient in 6 weeks and make an adjustment if necessary.  PHQ is 16 and GAD score is 15.  In comparison to last readings this is an improvement. - FLUoxetine  (PROZAC ) 10 MG capsule; Take 1 capsule (10 mg total) by mouth daily.  Dispense: 90 capsule; Refill: 1 - FLUoxetine  (PROZAC ) 20 MG capsule; Take 1 capsule (20 mg total) by mouth every morning.  Dispense: 90 capsule; Refill: 1  2. Erectile dysfunction, unspecified erectile dysfunction type .  Controlled.  Stable.  Patient is stable on current dosing of sildenafil  100 mg as needed and will continue at current - sildenafil  (VIAGRA ) 100 MG tablet; Take 1 tablet (100 mg total) by mouth daily as needed for erectile dysfunction.  Dispense: 10 tablet; Refill: 11  3. Primary hypertension (Primary) Episodic.  Waxes and wanes and is elevated today and remains elevated on second reading.  Blood pressure on second reading was still 130/88 I feel that given the waxing and waning that we need to start lisinopril  10 mg once a day and will recheck patient in 6 weeks. - Renal Function Panel  4. Hypothyroidism, unspecified type  Holding on current dosing of levothyroxine  which is 50 mcg daily.  Will check TSH and pending results tomorrow will likely continue or adjust accordingly. - TSH    Alayne Allis, MD

## 2023-09-19 ENCOUNTER — Encounter: Payer: Self-pay | Admitting: Family Medicine

## 2023-09-19 LAB — RENAL FUNCTION PANEL
Albumin: 4.7 g/dL (ref 4.1–5.1)
BUN/Creatinine Ratio: 12 (ref 9–20)
BUN: 16 mg/dL (ref 6–24)
CO2: 22 mmol/L (ref 20–29)
Calcium: 10.2 mg/dL (ref 8.7–10.2)
Chloride: 103 mmol/L (ref 96–106)
Creatinine, Ser: 1.37 mg/dL — ABNORMAL HIGH (ref 0.76–1.27)
Glucose: 79 mg/dL (ref 70–99)
Phosphorus: 3.1 mg/dL (ref 2.8–4.1)
Potassium: 4.2 mmol/L (ref 3.5–5.2)
Sodium: 142 mmol/L (ref 134–144)
eGFR: 66 mL/min/{1.73_m2} (ref >59)

## 2023-09-19 LAB — TSH: TSH: 1.8 u[IU]/mL (ref 0.450–4.500)

## 2023-09-19 MED ORDER — LEVOTHYROXINE SODIUM 50 MCG PO TABS: 50.0000 ug | ORAL_TABLET | Freq: Every day | ORAL | 1 refills | Status: DC

## 2023-10-29 ENCOUNTER — Ambulatory Visit: Admitting: Student

## 2023-10-29 ENCOUNTER — Encounter: Payer: Self-pay | Admitting: Student

## 2023-10-29 VITALS — BP 112/88 | HR 84 | Ht 73.0 in | Wt 354.0 lb

## 2023-10-29 DIAGNOSIS — B351 Tinea unguium: Secondary | ICD-10-CM | POA: Diagnosis not present

## 2023-10-29 DIAGNOSIS — G4733 Obstructive sleep apnea (adult) (pediatric): Secondary | ICD-10-CM | POA: Insufficient documentation

## 2023-10-29 DIAGNOSIS — F332 Major depressive disorder, recurrent severe without psychotic features: Secondary | ICD-10-CM

## 2023-10-29 DIAGNOSIS — Z23 Encounter for immunization: Secondary | ICD-10-CM

## 2023-10-29 DIAGNOSIS — I10 Essential (primary) hypertension: Secondary | ICD-10-CM | POA: Diagnosis not present

## 2023-10-29 MED ORDER — TERBINAFINE HCL 250 MG PO TABS: 250.0000 mg | ORAL_TABLET | Freq: Every day | ORAL | 0 refills | Status: AC

## 2023-10-29 NOTE — Assessment & Plan Note (Signed)
 Not on CPAP, sleep apnea on home sleep study last year. Having difficulty staying asleep. Gave him contact information at [facility] to follow up on this.

## 2023-10-29 NOTE — Progress Notes (Signed)
 Established Patient Office Visit  Subjective   Patient ID: Willie Strong, male    DOB: 04-07-1981  Age: 43 y.o. MRN: [id number]  Chief Complaint  Patient presents with   Establish Care    Pt wanst to talk about blood work from last visit 09/18/23   Nail Problem    Pt has discoloring, thick toenails, X15 years. Toenails are sore    Patient presents today for hypertension follow up. He has been taking lisinopril  daily. Denies cough, dizziness, numbness, or tingling.   Reports wife recently started on antifungal for toenail infection. Patient report his toenails are thick and chip easily.   Patient Active Problem List   Diagnosis Date Noted   Hypertension 10/30/2023   Onychomycosis 10/30/2023   MDD (major depressive disorder), recurrent episode, severe (HCC) 10/30/2023   OSA (obstructive sleep apnea) 10/29/2023   Chronic sinusitis 09/20/2022   Insomnia 09/20/2022   Gastro-esophageal reflux 06/03/2018   Asthma 05/19/2013   Hypothyroid 05/19/2013   Morbid obesity (HCC) 03/23/2012      ROS Refer to HPI    Objective:     BP 112/88   Pulse 84   Ht 6' 1 (1.854 m)   Wt (!) 354 lb (160.6 kg)   SpO2 99%   BMI 46.70 kg/m  BP Readings from Last 3 Encounters:  10/29/23 112/88  09/18/23 130/88  08/07/23 120/86    Physical Exam Constitutional:      Appearance: He is obese.  HENT:     Mouth/Throat:     Mouth: Mucous membranes are moist.     Pharynx: Oropharynx is clear.   Cardiovascular:     Rate and Rhythm: Normal rate and regular rhythm.     Pulses: Normal pulses.  Pulmonary:     Effort: Pulmonary effort is normal.     Breath sounds: No rhonchi or rales.  Abdominal:     General: Abdomen is flat. Bowel sounds are normal. There is no distension.     Palpations: Abdomen is soft.     Tenderness: There is no abdominal tenderness.   Musculoskeletal:        General: Normal range of motion.     Right lower leg: No edema.     Left lower leg: No edema.   Skin:     General: Skin is warm and dry.     Capillary Refill: Capillary refill takes less than 2 seconds.     Comments: Hyperkeratotic yellowing toe nails bilaterally, some areas of chipping.    Neurological:     General: No focal deficit present.     Mental Status: He is alert and oriented to person, place, and time.   Psychiatric:        Mood and Affect: Mood normal.        Behavior: Behavior normal.        10/29/2023    4:11 PM 09/18/2023    3:45 PM 08/07/2023    3:54 PM  Depression screen PHQ 2/9  Decreased Interest 3 3 3   Down, Depressed, Hopeless 2 2 2   PHQ - 2 Score 5 5 5   Altered sleeping 3 3 3   Tired, decreased energy 3 2 3   Change in appetite 2 1 3   Feeling bad or failure about yourself  2 2 2   Trouble concentrating 2 1 2   Moving slowly or fidgety/restless 3 2 3   Suicidal thoughts 0 0 0  PHQ-9 Score 20 16 21   Difficult doing work/chores  Somewhat difficult Somewhat difficult  10/29/2023    4:12 PM 09/18/2023    3:45 PM 08/07/2023    3:54 PM 06/24/2023    2:57 PM  GAD 7 : Generalized Anxiety Score  Nervous, Anxious, on Edge 3 3 3 3   Control/stop worrying 3 2 2 1   Worry too much - different things 3 2 3 3   Trouble relaxing 3 2 3 1   Restless 3 2 3 3   Easily annoyed or irritable 3 3 3 3   Afraid - awful might happen 2 1 2 1   Total GAD 7 Score 20 15 19 15   Anxiety Difficulty Somewhat difficult Somewhat difficult Somewhat difficult Very difficult    No results found for any visits on 10/29/23.  Last CBC Lab Results  Component Value Date   WBC 7.2 05/15/2023   HGB 17.2 (H) 05/15/2023   HCT 50.1 05/15/2023   MCV 92.3 05/15/2023   MCH 31.7 05/15/2023   RDW 12.3 05/15/2023   PLT 244 05/15/2023   Last metabolic panel Lab Results  Component Value Date   GLUCOSE 79 09/18/2023   NA 142 09/18/2023   K 4.2 09/18/2023   CL 103 09/18/2023   CO2 22 09/18/2023   BUN 16 09/18/2023   CREATININE 1.37 (H) 09/18/2023   EGFR 66 09/18/2023   CALCIUM 10.2 09/18/2023   PHOS  3.1 09/18/2023   PROT 7.5 05/15/2023   ALBUMIN 4.7 09/18/2023   BILITOT 0.8 05/15/2023   ALKPHOS 43 05/15/2023   AST 26 05/15/2023   ALT 31 05/15/2023   ANIONGAP 9 05/15/2023      The ASCVD Risk score (Arnett DK, et al., 2019) failed to calculate for the following reasons:   Cannot find a previous HDL lab   Cannot find a previous total cholesterol lab    Assessment & Plan:  Primary hypertension Assessment & Plan: He was started on lisinopril  at last visit. BP is well controlled. sCr mildly elevated at last visit at 1.3, baseline is ~1.1. This may be related to NSAID use. Discussed reducing use. BMP today. BP may improve with CPAP usage, will have him follow up with sleep medicine regarding OSA.  Orders: -     Basic metabolic panel with GFR  OSA (obstructive sleep apnea) Assessment & Plan: Not on CPAP, sleep apnea on home sleep study last year. Having difficulty staying asleep. Gave him contact information at [facility] to follow up on this.    Encounter for immunization -     Tdap vaccine greater than or equal to 7yo IM  Onychomycosis Assessment & Plan: Start terbinafine 250mg  daily for 12 weeks.    Severe episode of recurrent major depressive disorder, without psychotic features (HCC) Assessment & Plan: PHQ9 and GAD 7 are 20 today. Primarily reports work stressors he felt that increasing prozac  helped some but not enough. Denies SI, HI, and AVD. Increase fluoxetine  to 40 mg daily. Discussed therapy to discuss work frustrations, but he felt it was not helpful in the past.    Other orders -     Terbinafine HCl; Take 1 tablet (250 mg total) by mouth daily.  Dispense: 84 tablet; Refill: 0     Return in about 2 months (around 12/29/2023) for mood.    Barnetta Liberty, MD

## 2023-10-29 NOTE — Assessment & Plan Note (Addendum)
 TSH last month was normal continue levothyroxine  50 mcg daily.

## 2023-10-30 DIAGNOSIS — I10 Essential (primary) hypertension: Secondary | ICD-10-CM | POA: Insufficient documentation

## 2023-10-30 DIAGNOSIS — B351 Tinea unguium: Secondary | ICD-10-CM | POA: Insufficient documentation

## 2023-10-30 DIAGNOSIS — F332 Major depressive disorder, recurrent severe without psychotic features: Secondary | ICD-10-CM | POA: Insufficient documentation

## 2023-10-30 NOTE — Assessment & Plan Note (Signed)
 PHQ9 and GAD 7 are 20 today. Primarily reports work stressors he felt that increasing prozac  helped some but not enough. Denies SI, HI, and AVD. Increase fluoxetine  to 40 mg daily. Discussed therapy to discuss work frustrations, but he felt it was not helpful in the past.

## 2023-10-30 NOTE — Assessment & Plan Note (Signed)
 Start terbinafine 250mg  daily for 12 weeks.

## 2023-10-30 NOTE — Assessment & Plan Note (Addendum)
 He was started on lisinopril  at last visit. BP is well controlled. sCr mildly elevated at last visit at 1.3, baseline is ~1.1. This may be related to NSAID use. Discussed reducing use. BMP today. BP may improve with CPAP usage, will have him follow up with sleep medicine regarding OSA.

## 2023-11-14 ENCOUNTER — Ambulatory Visit: Payer: Self-pay | Admitting: Student

## 2023-11-14 LAB — BASIC METABOLIC PANEL WITH GFR
BUN/Creatinine Ratio: 16 (ref 9–20)
BUN: 17 mg/dL (ref 6–24)
CO2: 18 mmol/L — ABNORMAL LOW (ref 20–29)
Calcium: 9.5 mg/dL (ref 8.7–10.2)
Chloride: 106 mmol/L (ref 96–106)
Creatinine, Ser: 1.08 mg/dL (ref 0.76–1.27)
Glucose: 96 mg/dL (ref 70–99)
Potassium: 4.2 mmol/L (ref 3.5–5.2)
Sodium: 140 mmol/L (ref 134–144)
eGFR: 88 mL/min/{1.73_m2} (ref >59)

## 2023-12-29 ENCOUNTER — Ambulatory Visit: Admitting: Student

## 2023-12-29 ENCOUNTER — Encounter: Payer: Self-pay | Admitting: Student

## 2023-12-29 VITALS — BP 126/82 | HR 86 | Ht 73.0 in | Wt 349.4 lb

## 2023-12-29 DIAGNOSIS — F3341 Major depressive disorder, recurrent, in partial remission: Secondary | ICD-10-CM

## 2023-12-29 DIAGNOSIS — N529 Male erectile dysfunction, unspecified: Secondary | ICD-10-CM | POA: Diagnosis not present

## 2023-12-29 DIAGNOSIS — E039 Hypothyroidism, unspecified: Secondary | ICD-10-CM

## 2023-12-29 DIAGNOSIS — A048 Other specified bacterial intestinal infections: Secondary | ICD-10-CM | POA: Insufficient documentation

## 2023-12-29 DIAGNOSIS — G4733 Obstructive sleep apnea (adult) (pediatric): Secondary | ICD-10-CM

## 2023-12-29 DIAGNOSIS — J449 Chronic obstructive pulmonary disease, unspecified: Secondary | ICD-10-CM

## 2023-12-29 DIAGNOSIS — F332 Major depressive disorder, recurrent severe without psychotic features: Secondary | ICD-10-CM | POA: Diagnosis not present

## 2023-12-29 MED ORDER — LEVOTHYROXINE SODIUM 50 MCG PO TABS: 50.0000 ug | ORAL_TABLET | Freq: Every day | ORAL | 1 refills | Status: AC

## 2023-12-29 MED ORDER — STIOLTO RESPIMAT 2.5-2.5 MCG/ACT IN AERS: 2.0000 | INHALATION_SPRAY | RESPIRATORY_TRACT | 2 refills | Status: DC | PRN

## 2023-12-29 MED ORDER — FLUOXETINE HCL 40 MG PO CAPS: 40.0000 mg | ORAL_CAPSULE | Freq: Every morning | ORAL | 1 refills | Status: AC

## 2023-12-29 MED ORDER — LISINOPRIL 10 MG PO TABS: 10.0000 mg | ORAL_TABLET | Freq: Every day | ORAL | 1 refills | Status: AC

## 2023-12-29 NOTE — Progress Notes (Signed)
 Established Patient Office Visit  Subjective   Patient ID: Willie Strong, male    DOB: 27-Aug-1980  Age: 43 y.o. MRN: [id number]  Chief Complaint  Patient presents with   Depression   Medication Refill    HPI  Patient Active Problem List   Diagnosis Date Noted   Helicobacter pylori infection 12/29/2023   Hypertension 10/30/2023   Onychomycosis 10/30/2023   MDD (major depressive disorder), recurrent episode, severe (HCC) 10/30/2023   Obstructive sleep apnea of adult 10/29/2023   Rupture of biceps tendon 02/14/2023   Chronic sinusitis 09/20/2022   Insomnia 09/20/2022   Sprain of right middle finger 09/11/2022   Arthralgia of right elbow 10/23/2018   Gastro-esophageal reflux 06/03/2018   COPD (chronic obstructive pulmonary disease) (HCC) 05/19/2013   Hypothyroid 05/19/2013   Morbid obesity (HCC) 03/23/2012      ROS Refer to HPI    Objective:     BP 126/82   Pulse 86   Ht 6' 1 (1.854 m)   Wt (!) 349 lb 6 oz (158.5 kg)   SpO2 97%   BMI 46.09 kg/m  BP Readings from Last 3 Encounters:  12/29/23 126/82  10/29/23 112/88  09/18/23 130/88    Physical Exam     12/29/2023    3:57 PM 10/29/2023    4:11 PM 09/18/2023    3:45 PM  Depression screen PHQ 2/9  Decreased Interest 2 3 3   Down, Depressed, Hopeless 2 2 2   PHQ - 2 Score 4 5 5   Altered sleeping 3 3 3   Tired, decreased energy 3 3 2   Change in appetite 1 2 1   Feeling bad or failure about yourself  2 2 2   Trouble concentrating 2 2 1   Moving slowly or fidgety/restless 2 3 2   Suicidal thoughts 0 0 0  PHQ-9 Score 17 20 16   Difficult doing work/chores Somewhat difficult  Somewhat difficult       12/29/2023    3:57 PM 10/29/2023    4:12 PM 09/18/2023    3:45 PM 08/07/2023    3:54 PM  GAD 7 : Generalized Anxiety Score  Nervous, Anxious, on Edge 3 3 3 3   Control/stop worrying 2 3 2 2   Worry too much - different things 2 3 2 3   Trouble relaxing 3 3 2 3   Restless 2 3 2 3   Easily annoyed or irritable 3 3 3 3    Afraid - awful might happen 1 2 1 2   Total GAD 7 Score 16 20 15 19   Anxiety Difficulty Somewhat difficult Somewhat difficult Somewhat difficult Somewhat difficult    No results found for any visits on 12/29/23.  Last CBC Lab Results  Component Value Date   WBC 7.2 05/15/2023   HGB 17.2 (H) 05/15/2023   HCT 50.1 05/15/2023   MCV 92.3 05/15/2023   MCH 31.7 05/15/2023   RDW 12.3 05/15/2023   PLT 244 05/15/2023   Last metabolic panel Lab Results  Component Value Date   GLUCOSE 96 11/13/2023   NA 140 11/13/2023   K 4.2 11/13/2023   CL 106 11/13/2023   CO2 18 (L) 11/13/2023   BUN 17 11/13/2023   CREATININE 1.08 11/13/2023   EGFR 88 11/13/2023   CALCIUM 9.5 11/13/2023   PHOS 3.1 09/18/2023   PROT 7.5 05/15/2023   ALBUMIN 4.7 09/18/2023   BILITOT 0.8 05/15/2023   ALKPHOS 43 05/15/2023   AST 26 05/15/2023   ALT 31 05/15/2023   ANIONGAP 9 05/15/2023  The ASCVD Risk score (Arnett DK, et al., 2019) failed to calculate for the following reasons:   Cannot find a previous HDL lab   Cannot find a previous total cholesterol lab    Assessment & Plan:  Severe episode of recurrent major depressive disorder, without psychotic features (HCC) Assessment & Plan: Has not increased fluoxetine  as discussed at last visit. PHQ remains elevated at 17. Denies SI, HI, AVD. He is considering counseling but concerned it will not be helpful.  -increase fluoxetine  to 40 mg daily, follow up in 2 months   Recurrent major depressive disorder, in partial remission (HCC) -     FLUoxetine  HCl; Take 1 capsule (40 mg total) by mouth every morning.  Dispense: 90 capsule; Refill: 1  Acquired hypothyroidism Assessment & Plan: TSH is normal, continue levothyroxine  50 mcg daily, refilled today.   Orders: -     Levothyroxine  Sodium; Take 1 tablet (50 mcg total) by mouth daily before breakfast.  Dispense: 90 tablet; Refill: 1  Erectile dysfunction, unspecified erectile dysfunction type  Chronic  obstructive pulmonary disease, unspecified COPD type (HCC) Assessment & Plan: Former smoker, was controlled on Stiolto Respimat   as needed, feels he is wheezing more frequently in the hot weather. Increase to using stiolto daily.  Orders: -     Stiolto Respimat ; Inhale 2 puffs into the lungs as needed.  Dispense: 4 g; Refill: 2  Obstructive sleep apnea of adult Assessment & Plan: Has not followed up with sleep medicine, he will call regarding CPAP.    Other orders -     Lisinopril ; Take 1 tablet (10 mg total) by mouth daily.  Dispense: 90 tablet; Refill: 1     Return in about 2 months (around 02/28/2024) for physical.    Harlene Saddler, MD

## 2023-12-29 NOTE — Assessment & Plan Note (Addendum)
 Former smoker, was controlled on Stiolto Respimat   as needed, feels he is wheezing more frequently in the hot weather. Increase to using stiolto daily.

## 2023-12-29 NOTE — Assessment & Plan Note (Addendum)
 Has not increased fluoxetine  but would like to try this.

## 2023-12-29 NOTE — Assessment & Plan Note (Addendum)
 Former smoker, well controlled on Stiolto Respimat   as needed

## 2023-12-30 NOTE — Assessment & Plan Note (Addendum)
 BP well controlled, refill lisinopril  10 mg daily. Repeat BMP at next visit.

## 2023-12-30 NOTE — Assessment & Plan Note (Signed)
 Has not followed up with sleep medicine, he will call regarding CPAP.

## 2023-12-30 NOTE — Assessment & Plan Note (Deleted)
 TSH 1.8 on 09/18/2023, doing well on levothyroxine  50 mcg daily. Continue current medication.

## 2023-12-30 NOTE — Assessment & Plan Note (Signed)
 TSH is normal, continue levothyroxine  50 mcg daily, refilled today.

## 2024-01-20 ENCOUNTER — Other Ambulatory Visit: Payer: Self-pay | Admitting: Student

## 2024-01-20 NOTE — Telephone Encounter (Signed)
 Please review.  KP

## 2024-01-20 NOTE — Telephone Encounter (Signed)
 Requested medication (s) are due for refill today: yes*  Requested medication (s) are on the active medication list: yes  Last refill:  10/29/23  Future visit scheduled: ys  Notes to clinic:  ordered ends tomorrow 01/21/24   Requested Prescriptions  Pending Prescriptions Disp Refills   terbinafine  (LAMISIL ) 250 MG tablet [Pharmacy Med Name: TERBINAFINE  HCL 250 MG TABLET] 84 tablet 0    Sig: TAKE 1 TABLET BY MOUTH EVERY DAY     Off-Protocol Failed - 01/20/2024  3:30 PM      Failed - Medication not assigned to a protocol, review manually.      Passed - Valid encounter within last 12 months    Recent Outpatient Visits           3 weeks ago Severe episode of recurrent major depressive disorder, without psychotic features (HCC)   [facility], MD   2 months ago Primary hypertension   [facility], MD   4 months ago Primary hypertension   [facility], MD   5 months ago Recurrent major depressive disorder, in partial remission [facility])    [facility], MD   7 months ago Acquired hypothyroidism   [facility], MD

## 2024-02-18 ENCOUNTER — Encounter: Payer: Self-pay | Admitting: Student

## 2024-02-18 ENCOUNTER — Ambulatory Visit: Admitting: Student

## 2024-02-18 VITALS — BP 122/80 | HR 95 | Ht 73.0 in | Wt 350.0 lb

## 2024-02-18 DIAGNOSIS — F332 Major depressive disorder, recurrent severe without psychotic features: Secondary | ICD-10-CM

## 2024-02-18 NOTE — Patient Instructions (Addendum)
 If you are unable to schedule a visit with psychiatry in the right time frame you can go to [facility] for a walk in appointment.  Contact Us  Phone [phone]  Address [address] [place], [place] [address]  Hours Open 24/7. No appointment required.
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
## 2024-02-25 ENCOUNTER — Ambulatory Visit: Admitting: Student

## 2024-02-25 NOTE — Telephone Encounter (Signed)
 Please review patient's response.

## 2024-02-25 NOTE — Telephone Encounter (Signed)
Please review my response

## 2024-02-26 NOTE — Telephone Encounter (Signed)
 Please review

## 2024-03-03 ENCOUNTER — Encounter: Payer: Self-pay | Admitting: Family Medicine

## 2024-03-03 ENCOUNTER — Ambulatory Visit: Admitting: Family Medicine

## 2024-03-03 VITALS — BP 131/86 | HR 83 | Temp 99.1°F | Resp 18 | Ht 73.0 in | Wt 353.0 lb

## 2024-03-03 DIAGNOSIS — I10 Essential (primary) hypertension: Secondary | ICD-10-CM

## 2024-03-03 DIAGNOSIS — J449 Chronic obstructive pulmonary disease, unspecified: Secondary | ICD-10-CM

## 2024-03-03 DIAGNOSIS — F4321 Adjustment disorder with depressed mood: Secondary | ICD-10-CM | POA: Diagnosis not present

## 2024-03-03 DIAGNOSIS — R6882 Decreased libido: Secondary | ICD-10-CM

## 2024-03-03 MED ORDER — STIOLTO RESPIMAT 2.5-2.5 MCG/ACT IN AERS: 2.0000 | INHALATION_SPRAY | RESPIRATORY_TRACT | 2 refills | Status: AC | PRN

## 2024-03-03 NOTE — Assessment & Plan Note (Signed)
 He is taking Zoloft for adjustment disorder
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
## 2024-03-05 ENCOUNTER — Encounter: Admitting: Student

## 2024-03-05 ENCOUNTER — Ambulatory Visit: Payer: Self-pay | Admitting: Family Medicine

## 2024-03-05 LAB — CBC WITH DIFFERENTIAL/PLATELET
Basophils Absolute: 0 x10E3/uL (ref 0.0–0.2)
Basos: 1 %
EOS (ABSOLUTE): 0.1 x10E3/uL (ref 0.0–0.4)
Eos: 2 %
Hematocrit: 47.2 % (ref 37.5–51.0)
Hemoglobin: 15.5 g/dL (ref 13.0–17.7)
Immature Grans (Abs): 0 x10E3/uL (ref 0.0–0.1)
Immature Granulocytes: 0 %
Lymphocytes Absolute: 1.8 x10E3/uL (ref 0.7–3.1)
Lymphs: 30 %
MCH: 31.6 pg (ref 26.6–33.0)
MCHC: 32.8 g/dL (ref 31.5–35.7)
MCV: 96 fL (ref 79–97)
Monocytes Absolute: 0.6 x10E3/uL (ref 0.1–0.9)
Monocytes: 10 %
Neutrophils Absolute: 3.4 x10E3/uL (ref 1.4–7.0)
Neutrophils: 57 %
Platelets: 239 x10E3/uL (ref 150–450)
RBC: 4.9 x10E6/uL (ref 4.14–5.80)
RDW: 12.4 % (ref 11.6–15.4)
WBC: 5.9 x10E3/uL (ref 3.4–10.8)

## 2024-03-05 LAB — COMPREHENSIVE METABOLIC PANEL WITH GFR
ALT: 26 IU/L (ref 0–44)
AST: 25 IU/L (ref 0–40)
Albumin: 4.4 g/dL (ref 4.1–5.1)
Alkaline Phosphatase: 51 IU/L (ref 47–123)
BUN/Creatinine Ratio: 14 (ref 9–20)
BUN: 15 mg/dL (ref 6–24)
Bilirubin Total: 0.5 mg/dL (ref 0.0–1.2)
CO2: 21 mmol/L (ref 20–29)
Calcium: 9.4 mg/dL (ref 8.7–10.2)
Chloride: 101 mmol/L (ref 96–106)
Creatinine, Ser: 1.09 mg/dL (ref 0.76–1.27)
Globulin, Total: 2.4 g/dL (ref 1.5–4.5)
Glucose: 95 mg/dL (ref 70–99)
Potassium: 4.4 mmol/L (ref 3.5–5.2)
Sodium: 140 mmol/L (ref 134–144)
Total Protein: 6.8 g/dL (ref 6.0–8.5)
eGFR: 87 mL/min/1.73 (ref >59)

## 2024-03-05 LAB — LIPID PANEL
Chol/HDL Ratio: 4.8 ratio (ref 0.0–5.0)
Cholesterol, Total: 205 mg/dL — ABNORMAL HIGH (ref 100–199)
HDL: 43 mg/dL (ref >39)
LDL Chol Calc (NIH): 142 mg/dL — ABNORMAL HIGH (ref 0–99)
Triglycerides: 111 mg/dL (ref 0–149)
VLDL Cholesterol Cal: 20 mg/dL (ref 5–40)

## 2024-03-05 LAB — TESTOSTERONE,FREE AND TOTAL
Testosterone, Free: 10.8 pg/mL (ref 6.8–21.5)
Testosterone: 362 ng/dL (ref 264–916)

## 2024-03-05 LAB — TSH+FREE T4
Free T4: 1.18 ng/dL (ref 0.82–1.77)
TSH: 4.65 u[IU]/mL — ABNORMAL HIGH (ref 0.450–4.500)

## 2024-03-08 ENCOUNTER — Ambulatory Visit: Admitting: Family Medicine
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
## 2024-03-23 ENCOUNTER — Encounter: Payer: Self-pay | Admitting: Psychiatry

## 2024-03-23 ENCOUNTER — Ambulatory Visit (INDEPENDENT_AMBULATORY_CARE_PROVIDER_SITE_OTHER): Admitting: Psychiatry

## 2024-03-23 ENCOUNTER — Other Ambulatory Visit: Payer: Self-pay

## 2024-03-23 VITALS — BP 144/78 | HR 92 | Temp 97.2°F | Ht 73.0 in | Wt 350.0 lb

## 2024-03-23 DIAGNOSIS — F331 Major depressive disorder, recurrent, moderate: Secondary | ICD-10-CM

## 2024-03-23 NOTE — Patient Instructions (Signed)
 Increase fluoxetine  60 mg daily  Referral to therapy Next appointment- 12/18 at 11 am
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
## 2024-05-06 ENCOUNTER — Encounter: Payer: Self-pay | Admitting: Psychiatry

## 2024-05-06 ENCOUNTER — Other Ambulatory Visit: Payer: Self-pay

## 2024-05-06 ENCOUNTER — Ambulatory Visit: Admitting: Psychiatry

## 2024-05-06 VITALS — BP 140/88 | HR 92 | Temp 97.7°F | Ht 73.0 in | Wt 349.8 lb

## 2024-05-06 DIAGNOSIS — G4700 Insomnia, unspecified: Secondary | ICD-10-CM | POA: Diagnosis not present

## 2024-05-06 DIAGNOSIS — F331 Major depressive disorder, recurrent, moderate: Secondary | ICD-10-CM

## 2024-05-06 MED ORDER — FLUOXETINE HCL 40 MG PO CAPS: 40.0000 mg | ORAL_CAPSULE | Freq: Every day | ORAL | 0 refills | Status: DC

## 2024-05-06 MED ORDER — FLUOXETINE HCL 20 MG PO CAPS: 20.0000 mg | ORAL_CAPSULE | Freq: Every day | ORAL | 0 refills | Status: DC

## 2024-05-06 MED ORDER — PRAZOSIN HCL 2 MG PO CAPS: 2.0000 mg | ORAL_CAPSULE | Freq: Every day | ORAL | 1 refills | Status: DC

## 2024-05-06 MED ORDER — PRAZOSIN HCL 1 MG PO CAPS: 1.0000 mg | ORAL_CAPSULE | Freq: Every day | ORAL | 0 refills | Status: DC

## 2024-05-06 NOTE — Patient Instructions (Signed)
 Restart fluoxetine  60 mg daily  After one week, start prazosin  1 mg at night for one week, then 2 mg at night Next appointment- 2/5 at 3 PM

## 2024-05-30 ENCOUNTER — Other Ambulatory Visit: Payer: Self-pay | Admitting: Psychiatry

## 2024-06-01 ENCOUNTER — Ambulatory Visit (INDEPENDENT_AMBULATORY_CARE_PROVIDER_SITE_OTHER)

## 2024-06-01 DIAGNOSIS — F331 Major depressive disorder, recurrent, moderate: Secondary | ICD-10-CM | POA: Insufficient documentation

## 2024-06-01 DIAGNOSIS — F439 Reaction to severe stress, unspecified: Secondary | ICD-10-CM | POA: Insufficient documentation
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
## 2024-06-04 ENCOUNTER — Encounter: Payer: Self-pay | Admitting: Family Medicine

## 2024-06-04 ENCOUNTER — Ambulatory Visit: Admitting: Family Medicine

## 2024-06-04 VITALS — BP 144/84 | HR 78 | Temp 97.9°F | Resp 16 | Wt 350.8 lb

## 2024-06-04 DIAGNOSIS — F4321 Adjustment disorder with depressed mood: Secondary | ICD-10-CM

## 2024-06-04 DIAGNOSIS — M25519 Pain in unspecified shoulder: Secondary | ICD-10-CM | POA: Diagnosis not present

## 2024-06-04 MED ORDER — MELOXICAM 7.5 MG PO TABS: 7.5000 mg | ORAL_TABLET | ORAL | 5 refills | Status: AC | PRN

## 2024-06-04 MED ORDER — CYCLOBENZAPRINE HCL 10 MG PO TABS: 10.0000 mg | ORAL_TABLET | Freq: Three times a day (TID) | ORAL | 5 refills | Status: AC | PRN

## 2024-06-05 ENCOUNTER — Telehealth: Payer: Self-pay | Admitting: Family Medicine

## 2024-06-05 NOTE — Telephone Encounter (Signed)
 Attempted to call patient to review his FMLA form but did not answer and VM was full

## 2024-06-05 NOTE — Assessment & Plan Note (Addendum)
 In therapy and it seems to be helping.  Also taking Minipress  2 mg And fluoxetine  60 mg a day.  Seems to be in a good mood.  Working on completing an FMLA form for him so that he can go to his psychological therapy sessions.

## 2024-06-05 NOTE — Progress Notes (Signed)
 "  Established Patient Office Visit  Subjective   Patient ID: Willie Strong, male    DOB: 1980/11/14  Age: 44 y.o. MRN: [id number]  Chief Complaint  Patient presents with   Follow-up    Pt. Here for a follow-up visit on insomnia and anxiety. Pt. Stats pain as 3 for the knees.    HPI Discussed the use of AI scribe software for clinical note transcription with the patient, who gave verbal consent to proceed.  History of Present Illness   Willie Strong is a delightful 44 year old male with morbid obesity, HTN, depression, COPD who presents for a follow-up regarding mental health management and medication review.  He is engaged in mental health treatment and found his first therapy session beneficial, learning techniques to manage anger and stress. He is working on building surveyor and reflecting on childhood experiences, particularly his relationship with his father. He is taking fluoxetine , with a total daily dose of 60 mg, and prazosin  2 mg at night, which he reports helps him sleep. He notes some improvement in mood and anger management since starting these medications.  He has returned to work after a period of absence and is managing his schedule to accommodate mental health appointments. He is seeking assistance with FMLA paperwork to ensure he can attend these appointments without financial penalty. He recently started engaging in activities he enjoys, such as hunting, which he finds relaxing. However, he mentions tension with his wife regarding his mental health treatment and personal activities.  He has a history of back, neck, and shoulder pain for which he previously used meloxicam  7.5 mg and Flexeril  10 mg, primarily in the evenings to manage pain after work. He recently ran out of these medications.  He seeks a refill.  He underwent a home sleep study which indicated mild sleep apnea, with a total of 43 apneas and a lowest oxygen saturation of 84%. He is attempting to manage  his weight by increasing physical activity and monitoring his diet.        Objective:     BP (!) 144/84 (Cuff Size: Normal)   Pulse 78   Temp 97.9 F (36.6 C) (Oral)   Resp 16   Wt (!) 350 lb 12.8 oz (159.1 kg)   SpO2 96%   BMI 46.28 kg/m    Physical Exam Vitals and nursing note reviewed.  Constitutional:      Appearance: Normal appearance.  HENT:     Head: Normocephalic and atraumatic.  Eyes:     Conjunctiva/sclera: Conjunctivae normal.  Cardiovascular:     Rate and Rhythm: Normal rate and regular rhythm.  Pulmonary:     Effort: Pulmonary effort is normal.     Breath sounds: Normal breath sounds.  Musculoskeletal:     Right lower leg: No edema.     Left lower leg: No edema.  Skin:    General: Skin is warm and dry.  Neurological:     Mental Status: He is alert and oriented to person, place, and time.  Psychiatric:        Mood and Affect: Mood normal.        Behavior: Behavior normal.        Thought Content: Thought content normal.        Judgment: Judgment normal.          No results found for any visits on 06/04/24.    The 10-year ASCVD risk score (Arnett DK, et al., 2019) is: 2.4%  Assessment & Plan:  Shoulder pain, unspecified chronicity, unspecified laterality -     Meloxicam ; Take 1 tablet (7.5 mg total) by mouth as needed.  Dispense: 30 tablet; Refill: 5 -     Cyclobenzaprine  HCl; Take 1 tablet (10 mg total) by mouth 3 (three) times daily as needed for muscle spasms.  Dispense: 30 tablet; Refill: 5  Adjustment disorder with depressed mood Assessment & Plan: In therapy and it seems to be helping.  Also taking Minipress  2 mg And fluoxetine  60 mg a day.  Seems to be in a good mood.  Working on completing an FMLA form for him so that he can go to his psychological therapy sessions.      Return in about 3 months (around 09/02/2024).    Kyla Duffy K Tyyonna Soucy, MD "

## 2024-06-07 ENCOUNTER — Telehealth: Payer: Self-pay | Admitting: Family Medicine

## 2024-06-07 ENCOUNTER — Encounter: Payer: Self-pay | Admitting: Family Medicine

## 2024-06-07 NOTE — Telephone Encounter (Signed)
 Did not answer phone and mailbox is full.

## 2024-06-15 ENCOUNTER — Ambulatory Visit

## 2024-06-15 DIAGNOSIS — F331 Major depressive disorder, recurrent, moderate: Secondary | ICD-10-CM

## 2024-06-15 DIAGNOSIS — F439 Reaction to severe stress, unspecified: Secondary | ICD-10-CM

## 2024-06-15 NOTE — Progress Notes (Signed)
 Virtual Visit via Video Note  I connected with Willie Strong on 06/15/24 at  4:00 PM EST by a video enabled telemedicine application and verified that I am speaking with the correct person using two identifiers.  Location: Patient: in his car at his worksite Provider: Remote office   I discussed the limitations of evaluation and management by telemedicine and the availability of in person appointments. The patient expressed understanding and agreed to proceed.  History of Present Illness: See below    Observations/Objective: See below   Assessment and Plan: See below   Follow Up Instructions: See below    I discussed the assessment and treatment plan with the patient. The patient was provided an opportunity to ask questions and all were answered. The patient agreed with the plan and demonstrated an understanding of the instructions.   The patient was advised to call back or seek an in-person evaluation if the symptoms worsen or if the condition fails to improve as anticipated.  I provided 58 minutes of non-face-to-face time during this encounter.   Willie Strong, [facility]   THERAPIST PROGRESS NOTE  Session Time: 74  Participation Level: Active  Behavioral Response: NeatAlertEuthymic  Type of Therapy: Individual Therapy  Treatment Goals addressed: STG: Willie Strong will participate in therapy to learn and develop skills to reduce the impact of the daily symptoms of depression from daily to no more than three times a week.  STG: Willie Strong will participate in therapy and engage in EMDR and other therapeutic techniques to process and resolve past trauma and reduce trauma reactivity from daily to no more than three times per week.    ProgressTowards Goals: Initial  Interventions: CBT  Summary: Willie Strong is a 44 y.o. male who presents with history of depression and trauma. Therapist greeted 10 warmly and spent a few minutes checking in and discussing how things  have been since the last session.  10 presents for session alert and oriented; mood and affect neutral. Speech is clear and coherent at normal rate and tone. Engaged and cooperative in session. Therapist spent the first part of session developing treatment plan and goals.  Therapist also spent time to assess management of behavioral symptoms and any safety concerns and current level of functioning.  Willie Strong denies all safety concerns and continues to work towards goals.    Therapist spent the second part of session on psychoeducation, skill development and therapeutic interventions in session.  He shared that he ended up working 2, 20 hour shifts with a three hour nap in between because the other teams schedule to relieve him just did not show up for work.  He continues to report frustration about being taken advantage of at work and having a hard time establishing boundaries as he feels he does not want to treat others the way they are treating him.  Therapist used motivational in reviewing techniques to help him process through how his current strategy may be contributing to him being taken advantage of.  OARS was used to help him think through alternate perspectives.  Reflective listening with key interjections was used to help him identify possible solutions.  Willie Strong well to the interventions and shared some ambivalence but was willing to consider some alternative options. A wrap up meditation activity was done at the end of the session and he Strong well to it.   Suicidal/Homicidal: No without intent/plan  Therapist Response: Therapist used Motivational Interviewing to facilitate discussion, elicit pertinent information and summarized  thoughts and feelings for clarity and accuracy. Used supportive language and validation to provide a safe space to process thoughts and feelings. Therapist used motivational interviewing in session as therapeutic techniques to address presenting concerns. Therapist  noted that him is stuck in a cycle of learned helplessness as his approach to work is allowing him to be taken advantage of but he is unwilling to change his strategy as he feels it would make him very much like those people that are creating the trouble by not pulling the room weight.  Therapist used reflective listening with key interjections to help him think through alternatives and role with his resistance but planted ideas for possibilities.  Therapist will continue to be supportive and allow him space and time to work through and get to preparation and action stage.  Therapist reviewed and summarized what was discussed in session today and asked Willie Strong if there were any questions and provided clarity as needed. No safety concerns were noted during session and SI/HI/AVH were not present. Homework was assigned. Therapist wrapped up with a mindfulness based humming activity and a follow up meeting time was scheduled.   Plan: Return again in 2 weeks.  Diagnosis: MDD (major depressive disorder), recurrent episode, moderate (HCC)  Trauma and stressor-related disorder  Collaboration of Care: Medication Management AEB chart review  Patient/Guardian was advised Release of Information must be obtained prior to any record release in order to collaborate their care with an outside provider. Patient/Guardian was advised if they have not already done so to contact the registration department to sign all necessary forms in order for us  to release information regarding their care.   Consent: Patient/Guardian gives verbal consent for treatment and assignment of benefits for services provided during this visit. Patient/Guardian expressed understanding and agreed to proceed.   Willie Strong, [facility] 06/15/2024

## 2024-06-18 NOTE — Progress Notes (Unsigned)
" ° °  THERAPIST PROGRESS NOTE  Session Time: ***  Participation Level: {BHH PARTICIPATION LEVEL:22264}  Behavioral Response: {Appearance:22683}{BHH LEVEL OF CONSCIOUSNESS:22305}{BHH MOOD:22306}  Type of Therapy: {CHL AMB BH Type of Therapy:21022741}  Treatment Goals addressed:  STG: Vontrell Pullman will participate in therapy to learn and develop skills to reduce the impact of the daily symptoms of depression from daily to no more than three times a week.  STG: Jamian Andujo will participate in therapy and engage in EMDR and other therapeutic techniques to process and resolve past trauma and reduce trauma reactivity from daily to no more than three times per week.  ProgressTowards Goals: {Progress Towards Goals:21014066}  Interventions: {CHL AMB BH Type of Intervention:21022753}  Summary: IZEL EISENHARDT is a 44 y.o. male who presents with a history of depression and trauma. Therapist greeted Rider warmly and spent a few minutes checking in and discussing how things have been since the last session.  Tim  presents for session alert and oriented; mood and affect neutral. Speech is clear and coherent at normal rate and tone. Engaged and cooperative in session. Therapist spent the first part of session reviewing treatment plan and goals to assess management of behavioral symptoms and any safety concerns and current level of functioning. Tim  denies *** safety concerns and continues to work towards goals.    Therapist spent the second part of session on psychoeducation, skill development and therapeutic interventions in session. Tim  shared***. Tim  responded well to the interventions and shared ***. A wrap up meditation activity was done at the end of the session and Tim  responded well to it.   Suicidal/Homicidal: {BHH YES OR NO:22294}{yes/no/with/without intent/plan:22693}  Therapist Response: Therapist used Motivational Interviewing to facilitate discussion, elicit pertinent information and  summarized thoughts and feelings for clarity and accuracy. Used supportive language and validation to provide a safe space to process thoughts and feelings. Therapist used ***  in session as therapeutic techniques to address presenting concerns. Therapist noted ***  Therapist reviewed and summarized what was discussed in session today and asked Tim  if there were any questions and provided clarity as needed. No safety concerns were noted during session *** and SI/HI/AVH were not present. Homework was assigned. Therapist wrapped up with a mindfulness based *** activity and a follow up meeting time was scheduled.   Plan: Return again in 1 weeks.  Diagnosis: MDD (major depressive disorder), recurrent episode, moderate (HCC)  Trauma and stressor-related disorder  Collaboration of Care: Medication Management AEB chart review  Patient/Guardian was advised Release of Information must be obtained prior to any record release in order to collaborate their care with an outside provider. Patient/Guardian was advised if they have not already done so to contact the registration department to sign all necessary forms in order for us  to release information regarding their care.   Consent: Patient/Guardian gives verbal consent for treatment and assignment of benefits for services provided during this visit. Patient/Guardian expressed understanding and agreed to proceed.   Curtiss RAYMOND Carrie, [facility] 06/18/2024  "
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
## 2024-06-22 ENCOUNTER — Ambulatory Visit

## 2024-06-24 ENCOUNTER — Ambulatory Visit (INDEPENDENT_AMBULATORY_CARE_PROVIDER_SITE_OTHER): Admitting: Psychiatry

## 2024-06-24 ENCOUNTER — Other Ambulatory Visit: Payer: Self-pay

## 2024-06-24 ENCOUNTER — Encounter: Payer: Self-pay | Admitting: Psychiatry

## 2024-06-24 VITALS — BP 136/85 | HR 96 | Temp 97.5°F | Ht 73.0 in | Wt 352.4 lb

## 2024-06-24 DIAGNOSIS — G4700 Insomnia, unspecified: Secondary | ICD-10-CM | POA: Diagnosis not present

## 2024-06-24 DIAGNOSIS — F331 Major depressive disorder, recurrent, moderate: Secondary | ICD-10-CM

## 2024-06-24 MED ORDER — FLUOXETINE HCL 20 MG PO CAPS: 20.0000 mg | ORAL_CAPSULE | Freq: Every day | ORAL | 1 refills | Status: AC

## 2024-06-24 MED ORDER — PRAZOSIN HCL 2 MG PO CAPS: 2.0000 mg | ORAL_CAPSULE | Freq: Every day | ORAL | 0 refills | Status: AC

## 2024-06-24 MED ORDER — FLUOXETINE HCL 40 MG PO CAPS: 40.0000 mg | ORAL_CAPSULE | Freq: Every day | ORAL | 1 refills | Status: AC

## 2024-06-24 NOTE — Patient Instructions (Signed)
 Continue fluoxetine  60 mg daily  Continue prazosin  2 mg at night Next appointment- 4/14 at 4:30

## 2024-06-25 ENCOUNTER — Encounter: Payer: Self-pay | Admitting: Family Medicine

## 2024-06-29 ENCOUNTER — Ambulatory Visit

## 2024-06-29 DIAGNOSIS — F331 Major depressive disorder, recurrent, moderate: Secondary | ICD-10-CM

## 2024-06-29 DIAGNOSIS — F439 Reaction to severe stress, unspecified: Secondary | ICD-10-CM

## 2024-07-13 ENCOUNTER — Ambulatory Visit

## 2024-08-31 ENCOUNTER — Ambulatory Visit: Admitting: Psychiatry

## 2024-09-03 ENCOUNTER — Ambulatory Visit

## 2024-09-07 ENCOUNTER — Ambulatory Visit: Admitting: Family Medicine
